# Patient Record
Sex: Female | Born: 1970 | Race: Black or African American | Hispanic: No | Marital: Single | Smoking: Never smoker
Health system: Southern US, Community
[De-identification: ages and names within clinical notes are randomized; demographics above are authoritative.]

## PROBLEM LIST (undated history)

## (undated) DIAGNOSIS — I2510 Atherosclerotic heart disease of native coronary artery without angina pectoris: Secondary | ICD-10-CM

## (undated) DIAGNOSIS — E079 Disorder of thyroid, unspecified: Secondary | ICD-10-CM

## (undated) DIAGNOSIS — E7800 Pure hypercholesterolemia, unspecified: Secondary | ICD-10-CM

## (undated) DIAGNOSIS — M549 Dorsalgia, unspecified: Secondary | ICD-10-CM

## (undated) DIAGNOSIS — G8929 Other chronic pain: Secondary | ICD-10-CM

## (undated) DIAGNOSIS — I10 Essential (primary) hypertension: Secondary | ICD-10-CM

## (undated) HISTORY — PX: THYROID SURGERY: SHX805

## (undated) HISTORY — PX: CORONARY ANGIOPLASTY WITH STENT PLACEMENT: SHX49

## (undated) HISTORY — PX: TOTAL HIP ARTHROPLASTY: SHX124

## (undated) HISTORY — PX: TUBAL LIGATION: SHX77

## (undated) HISTORY — PX: GASTRIC BYPASS: SHX52

## (undated) HISTORY — PX: TONSILLECTOMY: SUR1361

## (undated) HISTORY — PX: JOINT REPLACEMENT: SHX530

---

## 2009-06-25 ENCOUNTER — Ambulatory Visit: Payer: Self-pay | Admitting: Diagnostic Radiology

## 2009-06-25 ENCOUNTER — Emergency Department (HOSPITAL_BASED_OUTPATIENT_CLINIC_OR_DEPARTMENT_OTHER): Admission: EM | Admit: 2009-06-25 | Discharge: 2009-06-25 | Payer: Self-pay | Admitting: Emergency Medicine

## 2009-11-23 ENCOUNTER — Ambulatory Visit: Payer: Self-pay | Admitting: Diagnostic Radiology

## 2009-11-23 ENCOUNTER — Emergency Department (HOSPITAL_BASED_OUTPATIENT_CLINIC_OR_DEPARTMENT_OTHER): Admission: EM | Admit: 2009-11-23 | Discharge: 2009-11-23 | Payer: Self-pay | Admitting: Emergency Medicine

## 2010-07-21 ENCOUNTER — Emergency Department (HOSPITAL_BASED_OUTPATIENT_CLINIC_OR_DEPARTMENT_OTHER)
Admission: EM | Admit: 2010-07-21 | Discharge: 2010-07-21 | Payer: Self-pay | Source: Home / Self Care | Admitting: Emergency Medicine

## 2010-10-05 LAB — URINE CULTURE
Colony Count: 25000
Culture  Setup Time: 201112280653

## 2010-10-05 LAB — URINALYSIS, ROUTINE W REFLEX MICROSCOPIC
Glucose, UA: NEGATIVE mg/dL
Nitrite: NEGATIVE
pH: 5.5 (ref 5.0–8.0)

## 2010-10-05 LAB — URINE MICROSCOPIC-ADD ON

## 2010-10-13 LAB — URINALYSIS, ROUTINE W REFLEX MICROSCOPIC
Bilirubin Urine: NEGATIVE
Glucose, UA: NEGATIVE mg/dL
Protein, ur: NEGATIVE mg/dL
Urobilinogen, UA: 0.2 mg/dL (ref 0.0–1.0)
pH: 5.5 (ref 5.0–8.0)

## 2010-10-13 LAB — DIFFERENTIAL
Basophils Relative: 1 % (ref 0–1)
Lymphocytes Relative: 40 % (ref 12–46)
Lymphs Abs: 1.6 10*3/uL (ref 0.7–4.0)
Monocytes Absolute: 0.3 10*3/uL (ref 0.1–1.0)
Monocytes Relative: 7 % (ref 3–12)
Neutrophils Relative %: 49 % (ref 43–77)

## 2010-10-13 LAB — COMPREHENSIVE METABOLIC PANEL
ALT: 29 U/L (ref 0–35)
Albumin: 4 g/dL (ref 3.5–5.2)
Chloride: 106 mEq/L (ref 96–112)
GFR calc Af Amer: >60 mL/min (ref >60)
Sodium: 145 mEq/L (ref 135–145)
Total Bilirubin: 0.8 mg/dL (ref 0.3–1.2)

## 2010-10-13 LAB — CBC
Platelets: 312 10*3/uL (ref 150–400)
RBC: 4.1 MIL/uL (ref 3.87–5.11)
RDW: 14.4 % (ref 11.5–15.5)
WBC: 4.1 10*3/uL (ref 4.0–10.5)

## 2010-10-13 LAB — LIPASE, BLOOD: Lipase: 65 U/L (ref 23–300)

## 2010-10-13 LAB — URINE MICROSCOPIC-ADD ON

## 2010-10-13 LAB — URINE CULTURE

## 2010-10-27 LAB — CBC
MCHC: 35.1 g/dL (ref 30.0–36.0)
RBC: 3.8 MIL/uL — ABNORMAL LOW (ref 3.87–5.11)
RDW: 14.2 % (ref 11.5–15.5)
WBC: 4.4 10*3/uL (ref 4.0–10.5)

## 2010-10-27 LAB — WET PREP, GENITAL

## 2010-10-27 LAB — BASIC METABOLIC PANEL
CO2: 28 mEq/L (ref 19–32)
Chloride: 108 mEq/L (ref 96–112)
Creatinine, Ser: 1.2 mg/dL (ref 0.4–1.2)
GFR calc Af Amer: >60 mL/min (ref >60)
GFR calc non Af Amer: 50 mL/min — ABNORMAL LOW (ref >60)
Glucose, Bld: 92 mg/dL (ref 70–99)

## 2010-10-27 LAB — URINALYSIS, ROUTINE W REFLEX MICROSCOPIC
Bilirubin Urine: NEGATIVE
Glucose, UA: NEGATIVE mg/dL
Protein, ur: NEGATIVE mg/dL
pH: 5.5 (ref 5.0–8.0)

## 2010-10-27 LAB — DIFFERENTIAL
Basophils Absolute: 0 10*3/uL (ref 0.0–0.1)
Basophils Relative: 1 % (ref 0–1)
Eosinophils Absolute: 0.2 10*3/uL (ref 0.0–0.7)
Lymphs Abs: 1.9 10*3/uL (ref 0.7–4.0)
Neutro Abs: 2 10*3/uL (ref 1.7–7.7)

## 2010-10-27 LAB — PREGNANCY, URINE: Preg Test, Ur: NEGATIVE

## 2010-10-27 LAB — RPR: RPR Ser Ql: NONREACTIVE

## 2010-10-27 LAB — URINE MICROSCOPIC-ADD ON

## 2011-02-24 ENCOUNTER — Emergency Department (HOSPITAL_BASED_OUTPATIENT_CLINIC_OR_DEPARTMENT_OTHER)
Admission: EM | Admit: 2011-02-24 | Discharge: 2011-02-24 | Disposition: A | Discharge status: Home / Self Care | Payer: Self-pay | Attending: Emergency Medicine | Admitting: Emergency Medicine

## 2011-02-24 ENCOUNTER — Encounter: Payer: Self-pay | Admitting: *Deleted

## 2011-02-24 DIAGNOSIS — I10 Essential (primary) hypertension: Secondary | ICD-10-CM | POA: Insufficient documentation

## 2011-02-24 DIAGNOSIS — M545 Low back pain, unspecified: Secondary | ICD-10-CM | POA: Insufficient documentation

## 2011-02-24 DIAGNOSIS — M5430 Sciatica, unspecified side: Secondary | ICD-10-CM | POA: Insufficient documentation

## 2011-02-24 DIAGNOSIS — G8929 Other chronic pain: Secondary | ICD-10-CM | POA: Insufficient documentation

## 2011-02-24 HISTORY — DX: Dorsalgia, unspecified: M54.9

## 2011-02-24 HISTORY — DX: Other chronic pain: G89.29

## 2011-02-24 HISTORY — DX: Essential (primary) hypertension: I10

## 2011-02-24 MED ORDER — CYCLOBENZAPRINE HCL 10 MG PO TABS: 10.0000 mg | ORAL_TABLET | Freq: Two times a day (BID) | ORAL | Status: AC | PRN

## 2011-02-24 MED ORDER — HYDROCODONE-ACETAMINOPHEN 5-500 MG PO TABS: 1.0000 | ORAL_TABLET | Freq: Four times a day (QID) | ORAL | Status: AC | PRN

## 2011-02-24 MED ORDER — DEXAMETHASONE SODIUM PHOSPHATE 10 MG/ML IJ SOLN
10.0000 mg | Freq: Once | INTRAMUSCULAR | Status: AC
  Administered 2011-02-24: 10 mg via INTRAMUSCULAR

## 2011-02-24 MED ORDER — IBUPROFEN 800 MG PO TABS: 800.0000 mg | ORAL_TABLET | Freq: Three times a day (TID) | ORAL | Status: AC

## 2011-02-24 MED ORDER — KETOROLAC TROMETHAMINE 60 MG/2ML IM SOLN
60.0000 mg | Freq: Once | INTRAMUSCULAR | Status: AC
  Administered 2011-02-24: 60 mg via INTRAMUSCULAR

## 2011-02-24 NOTE — ED Notes (Signed)
Pt reports chronic back pain x months. Pt reports pain medications not working and wants MRI.

## 2011-02-24 NOTE — ED Provider Notes (Signed)
History     Chief Complaint  Patient presents with  . Back Pain  . Leg Pain   HPI Comments: Patient states that she has had lower back pain for a "long time". This has been a left lower back, left buttock, left upper leg pain that is sharp and shooting, radiates down to the knee. It is worse with straightening her leg and better when she flexes at the knee and the hip. She has relief at night when she's lying on her side. She denies urinary symptoms, fever, numbness, weakness of the leg. Symptoms are constant and has not improved with over-the-counter medications which her family doctor as prescribed.  Patient is a 40 y.o. female presenting with back pain and leg pain. The history is provided by the patient and a relative.  Back Pain  Associated symptoms include leg pain. Pertinent negatives include no fever, no numbness and no weakness.  Leg Pain  Pertinent negatives include no numbness.    Past Medical History  Diagnosis Date  . Chronic back pain   . Hypertension     Past Surgical History  Procedure Date  . Cesarean section   . Thyroid surgery   . Tonsillectomy     No family history on file.  History  Substance Use Topics  . Smoking status: Never Smoker   . Smokeless tobacco: Not on file  . Alcohol Use: No    OB History    Grav Para Term Preterm Abortions TAB SAB Ect Mult Living                  Review of Systems  Constitutional: Negative for fever and chills.  HENT: Negative for neck pain.   Gastrointestinal: Negative for nausea, vomiting and diarrhea.  Genitourinary: Negative for difficulty urinating.  Musculoskeletal: Positive for back pain.  Skin: Negative for rash.  Neurological: Negative for weakness and numbness.    Physical Exam  BP 162/116  Pulse 77  Temp(Src) 98.9 F (37.2 C) (Oral)  Resp 16  Ht 5\' 6"  (1.676 m)  Wt 270 lb (122.471 kg)  BMI 43.58 kg/m2  SpO2 98%  LMP 02/22/2011  Physical Exam  Constitutional: She appears well-developed  and well-nourished. No distress.  HENT:  Head: Normocephalic and atraumatic.  Eyes: Conjunctivae are normal. No scleral icterus.  Cardiovascular: Normal rate and regular rhythm.   No murmur heard. Pulmonary/Chest: Effort normal and breath sounds normal. No respiratory distress. She has no wheezes. She has no rales.  Musculoskeletal: Normal range of motion. She exhibits tenderness. She exhibits no edema.       Tenderness to palpation of the left lower back, left buttock. No central spinal tenderness to palpation on the thoracic or lumbar spines.  Neurological: She is alert.       Normal sensation and motor of the bilateral lower extremities.  Skin: Skin is warm and dry. No rash noted. She is not diaphoretic.    ED Course  Procedures  MDM Patient states that she has this chronic pain which does not appear to have any neurologic deficits with it. It is consistent with sciatica. Will give intramuscular Decadron and Toradol prior to discharge. I will recommend her followup with a neurosurgeon for further spinal evaluation.      Vida Roller, MD 02/24/11 [phone]

## 2011-03-29 ENCOUNTER — Emergency Department (HOSPITAL_BASED_OUTPATIENT_CLINIC_OR_DEPARTMENT_OTHER)
Admission: EM | Admit: 2011-03-29 | Discharge: 2011-03-29 | Disposition: A | Discharge status: Home / Self Care | Payer: Self-pay | Attending: Emergency Medicine | Admitting: Emergency Medicine

## 2011-03-29 ENCOUNTER — Encounter (HOSPITAL_BASED_OUTPATIENT_CLINIC_OR_DEPARTMENT_OTHER): Payer: Self-pay | Admitting: *Deleted

## 2011-03-29 DIAGNOSIS — E7800 Pure hypercholesterolemia, unspecified: Secondary | ICD-10-CM | POA: Insufficient documentation

## 2011-03-29 DIAGNOSIS — E079 Disorder of thyroid, unspecified: Secondary | ICD-10-CM | POA: Insufficient documentation

## 2011-03-29 DIAGNOSIS — G8929 Other chronic pain: Secondary | ICD-10-CM | POA: Insufficient documentation

## 2011-03-29 DIAGNOSIS — I10 Essential (primary) hypertension: Secondary | ICD-10-CM | POA: Insufficient documentation

## 2011-03-29 DIAGNOSIS — J069 Acute upper respiratory infection, unspecified: Secondary | ICD-10-CM | POA: Insufficient documentation

## 2011-03-29 HISTORY — DX: Pure hypercholesterolemia, unspecified: E78.00

## 2011-03-29 HISTORY — DX: Disorder of thyroid, unspecified: E07.9

## 2011-03-29 MED ORDER — CETIRIZINE-PSEUDOEPHEDRINE ER 5-120 MG PO TB12: 1.0000 | ORAL_TABLET | Freq: Two times a day (BID) | ORAL | Status: AC

## 2011-03-29 NOTE — ED Provider Notes (Signed)
History     CSN: [id number] Arrival date & time: 03/29/2011  7:50 PM  Chief Complaint  Patient presents with  . Nasal Congestion   Patient is a 40 y.o. female presenting with URI. The history is provided by the patient. No language interpreter was used.  URI The primary symptoms include headaches and cough. Primary symptoms do not include fever, vomiting or myalgias. The current episode started today. This is a new problem.  Symptoms associated with the illness include facial pain, sinus pressure and rhinorrhea.    Past Medical History  Diagnosis Date  . Chronic back pain   . Hypertension   . High cholesterol   . Thyroid disease     Past Surgical History  Procedure Date  . Cesarean section   . Thyroid surgery   . Tonsillectomy   . Thyroid surgery   . Cesarean section     No family history on file.  History  Substance Use Topics  . Smoking status: Never Smoker   . Smokeless tobacco: Not on file  . Alcohol Use: No    OB History    Grav Para Term Preterm Abortions TAB SAB Ect Mult Living                  Review of Systems  Constitutional: Negative for fever.  HENT: Positive for rhinorrhea and sinus pressure.   Respiratory: Positive for cough.   Cardiovascular: Negative.   Gastrointestinal: Negative for vomiting.  Musculoskeletal: Negative for myalgias.  Neurological: Positive for headaches.    Physical Exam  BP 179/96  Pulse 100  Temp(Src) 99.1 F (37.3 C) (Oral)  Resp 22  SpO2 100%  Physical Exam  Vitals reviewed. Constitutional: She is oriented to person, place, and time. She appears well-developed and well-nourished.  HENT:  Head: Normocephalic and atraumatic.  Right Ear: External ear normal.  Left Ear: External ear normal.  Nose: Rhinorrhea present.  Mouth/Throat: Oropharynx is clear and moist.  Eyes: Pupils are equal, round, and reactive to light.  Cardiovascular: Normal rate and regular rhythm.   Pulmonary/Chest: Effort normal and breath  sounds normal.  Musculoskeletal: Normal range of motion.  Neurological: She is alert and oriented to person, place, and time.  Skin: Skin is warm and dry.    ED Course  Procedures  MDM Will treat symptomatically no need for antibiotics at this time      Teressa Lower, NP 03/29/11 2052

## 2011-03-29 NOTE — ED Provider Notes (Signed)
Medical screening examination/treatment/procedure(s) were performed by non-physician practitioner and as supervising physician I was immediately available for consultation/collaboration.   Lashawndra Lampkins, MD 03/29/11 2326 

## 2011-03-29 NOTE — ED Notes (Signed)
Nasal congestion, facial pain, headache and right arm throbbing. Symptoms started this am.

## 2011-06-05 ENCOUNTER — Encounter (HOSPITAL_BASED_OUTPATIENT_CLINIC_OR_DEPARTMENT_OTHER): Payer: Self-pay | Admitting: Emergency Medicine

## 2011-06-05 ENCOUNTER — Emergency Department (HOSPITAL_BASED_OUTPATIENT_CLINIC_OR_DEPARTMENT_OTHER)
Admission: EM | Admit: 2011-06-05 | Discharge: 2011-06-05 | Disposition: A | Discharge status: Home / Self Care | Payer: Self-pay | Attending: Emergency Medicine | Admitting: Emergency Medicine

## 2011-06-05 DIAGNOSIS — E7800 Pure hypercholesterolemia, unspecified: Secondary | ICD-10-CM | POA: Insufficient documentation

## 2011-06-05 DIAGNOSIS — E079 Disorder of thyroid, unspecified: Secondary | ICD-10-CM | POA: Insufficient documentation

## 2011-06-05 DIAGNOSIS — H109 Unspecified conjunctivitis: Secondary | ICD-10-CM | POA: Insufficient documentation

## 2011-06-05 DIAGNOSIS — G8929 Other chronic pain: Secondary | ICD-10-CM | POA: Insufficient documentation

## 2011-06-05 DIAGNOSIS — I10 Essential (primary) hypertension: Secondary | ICD-10-CM | POA: Insufficient documentation

## 2011-06-05 DIAGNOSIS — J069 Acute upper respiratory infection, unspecified: Secondary | ICD-10-CM | POA: Insufficient documentation

## 2011-06-05 MED ORDER — POLYMYXIN B-TRIMETHOPRIM 10000-0.1 UNIT/ML-% OP SOLN
2.0000 [drp] | OPHTHALMIC | Status: DC
  Administered 2011-06-05: 2 [drp] via OPHTHALMIC
  Filled 2011-06-05: qty 10

## 2011-06-05 NOTE — ED Provider Notes (Signed)
History     CSN: [id number] Arrival date & time: 06/05/2011 12:32 PM   First MD Initiated Contact with Patient 06/05/11 1244      Chief Complaint  Patient presents with  . Nasal Congestion    pt reports sinus congestion with Sore throat x 1 week    (Consider location/radiation/quality/duration/timing/severity/associated sxs/prior treatment) HPI Patient presents with a complaint of nasal congestion and postnasal drip as well as left eye redness and irritation. She states the nasal congestion has been present for approximately one week. Her IV came red and painful about 2 days ago. She's had no changes in her vision. No fevers and no difficulty breathing. She's also had a mild nonproductive cough. No headaches. She has continued to drink liquids well. She takes Zyrtec-D as well as TheraFlu and states this has not been helping. She denies any other systemic symptoms.  Past Medical History  Diagnosis Date  . Chronic back pain   . Hypertension   . High cholesterol   . Thyroid disease     Past Surgical History  Procedure Date  . Cesarean section   . Thyroid surgery   . Tonsillectomy   . Thyroid surgery   . Cesarean section     History reviewed. No pertinent family history.  History  Substance Use Topics  . Smoking status: Never Smoker   . Smokeless tobacco: Not on file  . Alcohol Use: No    OB History    Grav Para Term Preterm Abortions TAB SAB Ect Mult Living                  Review of Systems ROS reviewed and otherwise negative except for mentioned in HPI Allergies  Review of patient's allergies indicates no known allergies.  Home Medications   Current Outpatient Rx  Name Route Sig Dispense Refill  . CETIRIZINE-PSEUDOEPHEDRINE 5-120 MG PO TB12 Oral Take 1 tablet by mouth 2 (two) times daily. 30 tablet 0  . HYDROCHLOROTHIAZIDE 25 MG PO TABS Oral Take 25 mg by mouth daily.      Marland Kitchen LISINOPRIL 10 MG PO TABS Oral Take 10 mg by mouth daily.      Marland Kitchen METOPROLOL  TARTRATE 25 MG PO TABS Oral Take 25 mg by mouth 2 (two) times daily.      Juanita Laster COLD & SORE THROAT PO Oral Take 1 packet by mouth once as needed.      Marland Kitchen ROSUVASTATIN CALCIUM 10 MG PO TABS Oral Take 10 mg by mouth daily.        BP 133/60  Pulse 77  Temp(Src) 98.7 F (37.1 C) (Oral)  Resp 24  Wt 235 lb (106.595 kg)  SpO2 99%  LMP 05/08/2011 Vitals reviewed Physical Exam Physical Examination: General appearance - alert, well appearing, and in no distress Mental status - alert, oriented to person, place, and time Eyes - pupils equal and reactive, extraocular eye movements intact, left conjunctival injection Nose - normal and patent, nasal turbinates erythematous Mouth - mucous membranes moist, pharynx normal without lesions, no erythema of OP Chest - clear to auscultation, no wheezes, rales or rhonchi, symmetric air entry Heart - normal rate, regular rhythm, normal S1, S2, no murmurs, rubs, clicks or gallops Abdomen - soft, nontender, nondistended, no masses or organomegaly Musculoskeletal - no joint tenderness, deformity or swelling Extremities - peripheral pulses normal, no pedal edema, no clubbing or cyanosis Skin - normal coloration and turgor, no rashes, no suspicious skin lesions noted  ED Course  Procedures (  including critical care time)  Labs Reviewed - No data to display No results found.   1. Upper respiratory infection   2. Conjunctivitis       MDM  Patient presenting with nasal congestion and postnasal drip without signs or symptoms of pneumonia or any other acute bacterial infection. Recommended continuing TheraFlu as needed for symptoms and continuing Zyrtec-D. She also appears to have left conjunctivitis. Recommended warm compresses 3 times daily and will also give Polytrim drops started in the ED. Patient was discharged with strict return precautions and recommended to followup with her primary doctor. She is agreeable with this plan.        Ethelda Chick, MD 06/05/11 1409

## 2011-06-05 NOTE — ED Notes (Signed)
Took theraflu, but no relief, no v/d, chills at night

## 2011-06-05 NOTE — ED Notes (Signed)
Pt present with sinus congestion and drainage x 1 week

## 2012-01-14 ENCOUNTER — Emergency Department (HOSPITAL_BASED_OUTPATIENT_CLINIC_OR_DEPARTMENT_OTHER)
Admission: EM | Admit: 2012-01-14 | Discharge: 2012-01-14 | Disposition: A | Discharge status: Home / Self Care | Payer: Self-pay | Attending: Emergency Medicine | Admitting: Emergency Medicine

## 2012-01-14 ENCOUNTER — Encounter (HOSPITAL_BASED_OUTPATIENT_CLINIC_OR_DEPARTMENT_OTHER): Payer: Self-pay | Admitting: *Deleted

## 2012-01-14 DIAGNOSIS — I10 Essential (primary) hypertension: Secondary | ICD-10-CM | POA: Insufficient documentation

## 2012-01-14 DIAGNOSIS — M5430 Sciatica, unspecified side: Secondary | ICD-10-CM | POA: Diagnosis present

## 2012-01-14 DIAGNOSIS — E7800 Pure hypercholesterolemia, unspecified: Secondary | ICD-10-CM | POA: Insufficient documentation

## 2012-01-14 DIAGNOSIS — G8929 Other chronic pain: Secondary | ICD-10-CM | POA: Insufficient documentation

## 2012-01-14 DIAGNOSIS — Z79899 Other long term (current) drug therapy: Secondary | ICD-10-CM | POA: Insufficient documentation

## 2012-01-14 DIAGNOSIS — Z9861 Coronary angioplasty status: Secondary | ICD-10-CM | POA: Insufficient documentation

## 2012-01-14 DIAGNOSIS — E079 Disorder of thyroid, unspecified: Secondary | ICD-10-CM | POA: Insufficient documentation

## 2012-01-14 DIAGNOSIS — I2510 Atherosclerotic heart disease of native coronary artery without angina pectoris: Secondary | ICD-10-CM | POA: Insufficient documentation

## 2012-01-14 HISTORY — DX: Atherosclerotic heart disease of native coronary artery without angina pectoris: I25.10

## 2012-01-14 MED ORDER — DEXAMETHASONE SODIUM PHOSPHATE 4 MG/ML IJ SOLN
10.0000 mg | Freq: Once | INTRAMUSCULAR | Status: AC
  Administered 2012-01-14: 10 mg via INTRAMUSCULAR
  Filled 2012-01-14: qty 1

## 2012-01-14 MED ORDER — DEXAMETHASONE SODIUM PHOSPHATE 4 MG/ML IJ SOLN
INTRAMUSCULAR | Status: AC
  Filled 2012-01-14: qty 2

## 2012-01-14 MED ORDER — OXYCODONE-ACETAMINOPHEN 5-325 MG PO TABS: 1.0000 | ORAL_TABLET | Freq: Four times a day (QID) | ORAL | Status: AC | PRN

## 2012-01-14 MED ORDER — IBUPROFEN 800 MG PO TABS: 800.0000 mg | ORAL_TABLET | Freq: Three times a day (TID) | ORAL | Status: AC

## 2012-01-14 MED ORDER — HYDROMORPHONE HCL PF 1 MG/ML IJ SOLN
1.0000 mg | Freq: Once | INTRAMUSCULAR | Status: AC
  Administered 2012-01-14: 1 mg via INTRAMUSCULAR
  Filled 2012-01-14: qty 1

## 2012-01-14 NOTE — ED Notes (Signed)
Patient states she has a history of low back pain.  Yesterday she developed low back pain with radiation into bilateral buttocks, left hip and left leg pain down to her left foot.  No known injury, this is the worse pain she has ever had with her back before.

## 2012-01-14 NOTE — Discharge Instructions (Signed)

## 2012-01-14 NOTE — ED Provider Notes (Signed)
History     CSN: [id number]  Arrival date & time 01/14/12  1154   First MD Initiated Contact with Patient 01/14/12 1243      Chief Complaint  Patient presents with  . Back Pain    (Consider location/radiation/quality/duration/timing/severity/associated sxs/prior treatment) HPI Pt with history of low back pain and sciatica reports she woke up yesterday with severe aching L lower back and buttock pain radiating into his left leg. No incontinence, fever or neurologic signs.  No new imaging.  Past Medical History  Diagnosis Date  . Chronic back pain   . Hypertension   . High cholesterol   . Thyroid disease   . Coronary artery disease     Past Surgical History  Procedure Date  . Cesarean section   . Thyroid surgery   . Tonsillectomy   . Thyroid surgery   . Cesarean section   . Coronary angioplasty with stent placement     No family history on file.  History  Substance Use Topics  . Smoking status: Never Smoker   . Smokeless tobacco: Not on file  . Alcohol Use: No    OB History    Grav Para Term Preterm Abortions TAB SAB Ect Mult Living                  Review of Systems All other systems reviewed and are negative except as noted in HPI.   Allergies  Review of patient's allergies indicates no known allergies.  Home Medications   Current Outpatient Rx  Name Route Sig Dispense Refill  . CETIRIZINE-PSEUDOEPHEDRINE ER 5-120 MG PO TB12 Oral Take 1 tablet by mouth 2 (two) times daily. 30 tablet 0  . HYDROCHLOROTHIAZIDE 25 MG PO TABS Oral Take 25 mg by mouth daily.      Marland Kitchen LISINOPRIL 10 MG PO TABS Oral Take 10 mg by mouth daily.      Marland Kitchen METOPROLOL TARTRATE 25 MG PO TABS Oral Take 25 mg by mouth 2 (two) times daily.      Juanita Laster COLD & SORE THROAT PO Oral Take 1 packet by mouth once as needed.      Marland Kitchen ROSUVASTATIN CALCIUM 10 MG PO TABS Oral Take 10 mg by mouth daily.        BP 137/95  Pulse 98  Temp 98.5 F (36.9 C) (Oral)  Resp 18  Ht 5\' 4"  (1.626 m)  Wt  272 lb (123.378 kg)  BMI 46.69 kg/m2  SpO2 99%  LMP 12/25/2011  Physical Exam  Nursing note and vitals reviewed. Constitutional: She is oriented to person, place, and time. She appears well-developed and well-nourished.  HENT:  Head: Normocephalic and atraumatic.  Eyes: EOM are normal. Pupils are equal, round, and reactive to light.  Neck: Normal range of motion. Neck supple.  Cardiovascular: Normal rate, normal heart sounds and intact distal pulses.   Pulmonary/Chest: Effort normal and breath sounds normal.  Abdominal: Bowel sounds are normal. She exhibits no distension. There is no tenderness.  Musculoskeletal: Normal range of motion. She exhibits tenderness. She exhibits no edema.       Tender over the L lower lumbar region and L sciatic notch  Neurological: She is alert and oriented to person, place, and time. She has normal strength. She displays normal reflexes. No cranial nerve deficit or sensory deficit. She exhibits normal muscle tone.  Skin: Skin is warm and dry. No rash noted.  Psychiatric: She has a normal mood and affect.    ED Course  Procedures (including critical care time)  Labs Reviewed - No data to display No results found.   No diagnosis found.    MDM  Pt with exacerbation of her chronic sciatica. Has had good relief with decadron in the past. Advised neurosurg followup.         Maddelyn Rocca B. Bernette Mayers, MD 01/14/12 1312

## 2012-05-05 ENCOUNTER — Encounter (HOSPITAL_BASED_OUTPATIENT_CLINIC_OR_DEPARTMENT_OTHER): Payer: Self-pay | Admitting: *Deleted

## 2012-05-05 ENCOUNTER — Emergency Department (HOSPITAL_BASED_OUTPATIENT_CLINIC_OR_DEPARTMENT_OTHER)
Admission: EM | Admit: 2012-05-05 | Discharge: 2012-05-05 | Disposition: A | Discharge status: Home / Self Care | Payer: Self-pay | Attending: Emergency Medicine | Admitting: Emergency Medicine

## 2012-05-05 DIAGNOSIS — N946 Dysmenorrhea, unspecified: Secondary | ICD-10-CM | POA: Insufficient documentation

## 2012-05-05 DIAGNOSIS — Z9861 Coronary angioplasty status: Secondary | ICD-10-CM | POA: Insufficient documentation

## 2012-05-05 DIAGNOSIS — M549 Dorsalgia, unspecified: Secondary | ICD-10-CM | POA: Insufficient documentation

## 2012-05-05 DIAGNOSIS — A499 Bacterial infection, unspecified: Secondary | ICD-10-CM | POA: Insufficient documentation

## 2012-05-05 DIAGNOSIS — I10 Essential (primary) hypertension: Secondary | ICD-10-CM | POA: Insufficient documentation

## 2012-05-05 DIAGNOSIS — E890 Postprocedural hypothyroidism: Secondary | ICD-10-CM | POA: Insufficient documentation

## 2012-05-05 DIAGNOSIS — I2510 Atherosclerotic heart disease of native coronary artery without angina pectoris: Secondary | ICD-10-CM | POA: Insufficient documentation

## 2012-05-05 DIAGNOSIS — N760 Acute vaginitis: Secondary | ICD-10-CM | POA: Insufficient documentation

## 2012-05-05 DIAGNOSIS — B9689 Other specified bacterial agents as the cause of diseases classified elsewhere: Secondary | ICD-10-CM | POA: Insufficient documentation

## 2012-05-05 DIAGNOSIS — E7800 Pure hypercholesterolemia, unspecified: Secondary | ICD-10-CM | POA: Insufficient documentation

## 2012-05-05 DIAGNOSIS — G8929 Other chronic pain: Secondary | ICD-10-CM | POA: Insufficient documentation

## 2012-05-05 LAB — URINALYSIS, ROUTINE W REFLEX MICROSCOPIC
Bilirubin Urine: NEGATIVE
Ketones, ur: NEGATIVE mg/dL
Leukocytes, UA: NEGATIVE
Nitrite: NEGATIVE
Urobilinogen, UA: 0.2 mg/dL (ref 0.0–1.0)
pH: 5.5 (ref 5.0–8.0)

## 2012-05-05 MED ORDER — HYDROCODONE-ACETAMINOPHEN 5-500 MG PO TABS: 1.0000 | ORAL_TABLET | Freq: Four times a day (QID) | ORAL | Status: DC | PRN

## 2012-05-05 MED ORDER — OXYCODONE-ACETAMINOPHEN 5-325 MG PO TABS
2.0000 | ORAL_TABLET | Freq: Once | ORAL | Status: AC
  Administered 2012-05-05: 2 via ORAL
  Filled 2012-05-05 (×2): qty 2

## 2012-05-05 MED ORDER — METRONIDAZOLE 500 MG PO TABS: 500.0000 mg | ORAL_TABLET | Freq: Two times a day (BID) | ORAL | Status: DC

## 2012-05-05 NOTE — ED Notes (Signed)
abd pain, nausea no menses x 2 months.

## 2012-05-05 NOTE — ED Provider Notes (Signed)
History     CSN: [id number]  Arrival date & time 05/05/12  1730   First MD Initiated Contact with Patient 05/05/12 2008      Chief Complaint  Patient presents with  . Abdominal Pain    (Consider location/radiation/quality/duration/timing/severity/associated sxs/prior treatment) HPI Comments: She complains of a two-day history of lower, cramping. She states it's in the middle of her abdomen and radiates around to the lower sides. She complains of some nausea but no vomiting. She's had some urinary frequency but no burning on urination or hematuria. Denies any vaginal bleeding or discharge. Her last menstrual period was in July. She denies any fevers. She states the cramping in has been worse in the last day. Denies any history of similar findings in the past. She's not taking anything at home for the symptoms.  Patient is a 41 y.o. female presenting with abdominal pain. The history is provided by the patient.  Abdominal Pain The primary symptoms of the illness include abdominal pain. The primary symptoms of the illness do not include fever, fatigue, shortness of breath, nausea, vomiting or diarrhea.  Additional symptoms associated with the illness include frequency. Symptoms associated with the illness do not include chills, diaphoresis, hematuria or back pain.    Past Medical History  Diagnosis Date  . Chronic back pain   . Hypertension   . High cholesterol   . Thyroid disease   . Coronary artery disease     Past Surgical History  Procedure Date  . Cesarean section   . Thyroid surgery   . Tonsillectomy   . Thyroid surgery   . Cesarean section   . Coronary angioplasty with stent placement     No family history on file.  History  Substance Use Topics  . Smoking status: Never Smoker   . Smokeless tobacco: Not on file  . Alcohol Use: No    OB History    Grav Para Term Preterm Abortions TAB SAB Ect Mult Living                  Review of Systems  Constitutional:  Negative for fever, chills, diaphoresis and fatigue.  HENT: Negative for congestion, rhinorrhea and sneezing.   Eyes: Negative.   Respiratory: Negative for cough, chest tightness and shortness of breath.   Cardiovascular: Negative for chest pain and leg swelling.  Gastrointestinal: Positive for abdominal pain. Negative for nausea, vomiting, diarrhea and blood in stool.  Genitourinary: Positive for frequency. Negative for hematuria, flank pain and difficulty urinating.  Musculoskeletal: Negative for back pain and arthralgias.  Skin: Negative for rash.  Neurological: Negative for dizziness, speech difficulty, weakness, numbness and headaches.    Allergies  Review of patient's allergies indicates no known allergies.  Home Medications   Current Outpatient Rx  Name Route Sig Dispense Refill  . HYDROCHLOROTHIAZIDE 25 MG PO TABS Oral Take 25 mg by mouth daily.      Marland Kitchen HYDROCODONE-ACETAMINOPHEN 5-500 MG PO TABS Oral Take 1-2 tablets by mouth every 6 (six) hours as needed for pain. 15 tablet 0  . LISINOPRIL 10 MG PO TABS Oral Take 10 mg by mouth daily.      Marland Kitchen METOPROLOL TARTRATE 25 MG PO TABS Oral Take 25 mg by mouth 2 (two) times daily.      Marland Kitchen METRONIDAZOLE 500 MG PO TABS Oral Take 1 tablet (500 mg total) by mouth 2 (two) times daily. 14 tablet 0  . THERAFLU COLD & SORE THROAT PO Oral Take 1 packet by  mouth once as needed.      Marland Kitchen ROSUVASTATIN CALCIUM 10 MG PO TABS Oral Take 10 mg by mouth daily.        BP 181/103  Pulse 91  Temp 98.3 F (36.8 C) (Oral)  Resp 18  SpO2 98%  LMP 02/17/2012  Physical Exam  Constitutional: She is oriented to person, place, and time. She appears well-developed and well-nourished.  HENT:  Head: Normocephalic and atraumatic.  Eyes: Pupils are equal, round, and reactive to light.  Neck: Normal range of motion. Neck supple.  Cardiovascular: Normal rate, regular rhythm and normal heart sounds.   Pulmonary/Chest: Effort normal and breath sounds normal. No  respiratory distress. She has no wheezes. She has no rales. She exhibits no tenderness.  Abdominal: Soft. Bowel sounds are normal. There is tenderness (mild tenderness to the suprapubic area). There is no rebound and no guarding.  Genitourinary: Vaginal discharge (slight white) found.       No CMT, no obvious adnexal tenderness or mass, but limited due to pt size  Musculoskeletal: Normal range of motion. She exhibits no edema.  Lymphadenopathy:    She has no cervical adenopathy.  Neurological: She is alert and oriented to person, place, and time.  Skin: Skin is warm and dry. No rash noted.  Psychiatric: She has a normal mood and affect.    ED Course  Procedures (including critical care time)  Results for orders placed during the hospital encounter of 05/05/12  URINALYSIS, ROUTINE W REFLEX MICROSCOPIC      Component Value Range   Color, Urine YELLOW  YELLOW   APPearance CLOUDY (*) CLEAR   Specific Gravity, Urine 1.017  1.005 - 1.030   pH 5.5  5.0 - 8.0   Glucose, UA NEGATIVE  NEGATIVE mg/dL   Hgb urine dipstick NEGATIVE  NEGATIVE   Bilirubin Urine NEGATIVE  NEGATIVE   Ketones, ur NEGATIVE  NEGATIVE mg/dL   Protein, ur NEGATIVE  NEGATIVE mg/dL   Urobilinogen, UA 0.2  0.0 - 1.0 mg/dL   Nitrite NEGATIVE  NEGATIVE   Leukocytes, UA NEGATIVE  NEGATIVE  PREGNANCY, URINE      Component Value Range   Preg Test, Ur NEGATIVE  NEGATIVE  WET PREP, GENITAL      Component Value Range   Yeast Wet Prep HPF POC NONE SEEN  NONE SEEN   Trich, Wet Prep NONE SEEN  NONE SEEN   Clue Cells Wet Prep HPF POC MODERATE (*) NONE SEEN   WBC, Wet Prep HPF POC RARE (*) NONE SEEN   No results found.    1. BV (bacterial vaginosis)   2. Dysmenorrhea       MDM  Will tx pt for BV, abd exam benign, not consistent with torsion/TOA.  No pain over appendix.  Will tx with flaygl, vicodin, f/u with her ob/gyn        Rolan Bucco, MD 05/05/12 2133

## 2012-05-06 LAB — GC/CHLAMYDIA PROBE AMP, GENITAL
Chlamydia, DNA Probe: NEGATIVE
GC Probe Amp, Genital: NEGATIVE

## 2012-10-05 ENCOUNTER — Encounter (HOSPITAL_BASED_OUTPATIENT_CLINIC_OR_DEPARTMENT_OTHER): Payer: Self-pay | Admitting: *Deleted

## 2012-10-05 ENCOUNTER — Emergency Department (HOSPITAL_BASED_OUTPATIENT_CLINIC_OR_DEPARTMENT_OTHER)
Admission: EM | Admit: 2012-10-05 | Discharge: 2012-10-05 | Disposition: A | Discharge status: Home / Self Care | Payer: Self-pay | Attending: Emergency Medicine | Admitting: Emergency Medicine

## 2012-10-05 DIAGNOSIS — M5430 Sciatica, unspecified side: Secondary | ICD-10-CM | POA: Insufficient documentation

## 2012-10-05 DIAGNOSIS — I2510 Atherosclerotic heart disease of native coronary artery without angina pectoris: Secondary | ICD-10-CM | POA: Insufficient documentation

## 2012-10-05 DIAGNOSIS — Z9861 Coronary angioplasty status: Secondary | ICD-10-CM | POA: Insufficient documentation

## 2012-10-05 DIAGNOSIS — E079 Disorder of thyroid, unspecified: Secondary | ICD-10-CM | POA: Insufficient documentation

## 2012-10-05 DIAGNOSIS — Z79899 Other long term (current) drug therapy: Secondary | ICD-10-CM | POA: Insufficient documentation

## 2012-10-05 DIAGNOSIS — Z3202 Encounter for pregnancy test, result negative: Secondary | ICD-10-CM | POA: Insufficient documentation

## 2012-10-05 DIAGNOSIS — G8929 Other chronic pain: Secondary | ICD-10-CM | POA: Insufficient documentation

## 2012-10-05 DIAGNOSIS — I10 Essential (primary) hypertension: Secondary | ICD-10-CM | POA: Insufficient documentation

## 2012-10-05 DIAGNOSIS — E7800 Pure hypercholesterolemia, unspecified: Secondary | ICD-10-CM | POA: Insufficient documentation

## 2012-10-05 DIAGNOSIS — M5432 Sciatica, left side: Secondary | ICD-10-CM

## 2012-10-05 LAB — URINALYSIS, ROUTINE W REFLEX MICROSCOPIC
Leukocytes, UA: NEGATIVE
Nitrite: NEGATIVE
Specific Gravity, Urine: 1.021 (ref 1.005–1.030)
Urobilinogen, UA: 0.2 mg/dL (ref 0.0–1.0)
pH: 5 (ref 5.0–8.0)

## 2012-10-05 LAB — PREGNANCY, URINE: Preg Test, Ur: NEGATIVE

## 2012-10-05 MED ORDER — METHYLPREDNISOLONE 4 MG PO KIT: PACK | ORAL | Status: DC

## 2012-10-05 MED ORDER — KETOROLAC TROMETHAMINE 60 MG/2ML IM SOLN
60.0000 mg | Freq: Once | INTRAMUSCULAR | Status: AC
  Administered 2012-10-05: 60 mg via INTRAMUSCULAR
  Filled 2012-10-05: qty 2

## 2012-10-05 MED ORDER — DIAZEPAM 5 MG PO TABS
5.0000 mg | ORAL_TABLET | Freq: Once | ORAL | Status: AC
  Administered 2012-10-05: 5 mg via ORAL
  Filled 2012-10-05: qty 1

## 2012-10-05 MED ORDER — IBUPROFEN 800 MG PO TABS: 800.0000 mg | ORAL_TABLET | Freq: Three times a day (TID) | ORAL | Status: DC

## 2012-10-05 MED ORDER — OXYCODONE-ACETAMINOPHEN 5-325 MG PO TABS: 2.0000 | ORAL_TABLET | ORAL | Status: DC | PRN

## 2012-10-05 MED ORDER — HYDROMORPHONE HCL PF 2 MG/ML IJ SOLN
2.0000 mg | Freq: Once | INTRAMUSCULAR | Status: AC
  Administered 2012-10-05: 2 mg via INTRAMUSCULAR
  Filled 2012-10-05: qty 1

## 2012-10-05 NOTE — ED Provider Notes (Signed)
History     CSN: [id number]  Arrival date & time 10/05/12  1842   First MD Initiated Contact with Patient 10/05/12 1945      Chief Complaint  Patient presents with  . Back Pain    (Consider location/radiation/quality/duration/timing/severity/associated sxs/prior treatment) HPI Comments: Obese patient with history of sciatica presenting with left-sided low back pain, buttock pain and leg pain for the past 4 days. Feels similar to her previous sciatica. Denies any difficulty with urination. No blood or bladder incontinence. Denies any lifting injuries or twisting injuries. Denies any abdominal pain, nausea, vomiting or fever. No illicit drug use or cancer history. No dysuria hematuria. She's been taking naproxen without relief.  The history is provided by the patient.    Past Medical History  Diagnosis Date  . Chronic back pain   . Hypertension   . High cholesterol   . Thyroid disease   . Coronary artery disease     Past Surgical History  Procedure Laterality Date  . Cesarean section    . Thyroid surgery    . Tonsillectomy    . Thyroid surgery    . Cesarean section    . Coronary angioplasty with stent placement      No family history on file.  History  Substance Use Topics  . Smoking status: Never Smoker   . Smokeless tobacco: Not on file  . Alcohol Use: No    OB History   Grav Para Term Preterm Abortions TAB SAB Ect Mult Living                  Review of Systems  Constitutional: Negative for fever, activity change and appetite change.  HENT: Negative for congestion and rhinorrhea.   Respiratory: Negative for cough, chest tightness and shortness of breath.   Cardiovascular: Negative for chest pain.  Gastrointestinal: Negative for nausea, vomiting and abdominal pain.  Genitourinary: Negative for dysuria and hematuria.  Musculoskeletal: Positive for back pain. Negative for myalgias and arthralgias.  Skin: Negative for rash.  Neurological: Negative for  dizziness, weakness and headaches.  A complete 10 system review of systems was obtained and all systems are negative except as noted in the HPI and PMH.    Allergies  Review of patient's allergies indicates no known allergies.  Home Medications   Current Outpatient Rx  Name  Route  Sig  Dispense  Refill  . hydrochlorothiazide 25 MG tablet   Oral   Take 25 mg by mouth daily.           Marland Kitchen HYDROcodone-acetaminophen (VICODIN) 5-500 MG per tablet   Oral   Take 1-2 tablets by mouth every 6 (six) hours as needed for pain.   15 tablet   0   . ibuprofen (ADVIL,MOTRIN) 800 MG tablet   Oral   Take 1 tablet (800 mg total) by mouth 3 (three) times daily.   21 tablet   0   . lisinopril (PRINIVIL,ZESTRIL) 10 MG tablet   Oral   Take 10 mg by mouth daily.           . methylPREDNISolone (MEDROL, PAK,) 4 MG tablet      follow package directions   21 tablet   0   . metoprolol tartrate (LOPRESSOR) 25 MG tablet   Oral   Take 25 mg by mouth 2 (two) times daily.           . metroNIDAZOLE (FLAGYL) 500 MG tablet   Oral   Take 1 tablet (500  mg total) by mouth 2 (two) times daily.   14 tablet   0   . oxyCODONE-acetaminophen (PERCOCET/ROXICET) 5-325 MG per tablet   Oral   Take 2 tablets by mouth every 4 (four) hours as needed for pain.   15 tablet   0   . Pheniramine-PE-APAP (THERAFLU COLD & SORE THROAT PO)   Oral   Take 1 packet by mouth once as needed.           . rosuvastatin (CRESTOR) 10 MG tablet   Oral   Take 10 mg by mouth daily.             BP 138/87  Pulse 93  Temp(Src) 98.2 F (36.8 C) (Oral)  Resp 20  Wt 272 lb (123.378 kg)  BMI 46.67 kg/m2  SpO2 100%  LMP 09/14/2012  Physical Exam  Constitutional: She is oriented to person, place, and time. She appears well-developed and well-nourished. No distress.  Morbidly obese  HENT:  Head: Normocephalic and atraumatic.  Mouth/Throat: Oropharynx is clear and moist. No oropharyngeal exudate.  Eyes:  Conjunctivae and EOM are normal. Pupils are equal, round, and reactive to light.  Neck: Normal range of motion. Neck supple.  Cardiovascular: Normal rate, regular rhythm and normal heart sounds.   No murmur heard. Pulmonary/Chest: Effort normal and breath sounds normal. No respiratory distress.  Abdominal: Soft. There is no tenderness. There is no rebound and no guarding.  Musculoskeletal: Normal range of motion. She exhibits tenderness. She exhibits no edema.  Left paraspinal lumbar tenderness 5/5 strength in bilateral lower extremities. Ankle plantar and dorsiflexion intact. Great toe extension intact bilaterally. +2 DP and PT pulses. +2 patellar reflexes bilaterally. Normal gait.   Neurological: She is alert and oriented to person, place, and time. No cranial nerve deficit. She exhibits normal muscle tone. Coordination normal.  Skin: Skin is warm.    ED Course  Procedures (including critical care time)  Labs Reviewed  URINALYSIS, ROUTINE W REFLEX MICROSCOPIC - Abnormal; Notable for the following:    APPearance CLOUDY (*)    All other components within normal limits  PREGNANCY, URINE   No results found.   1. Sciatica, left       MDM  Left-sided low back pain radiating down the left leg consistent with previous sciatica. No weakness, numbness or tingling. No neurological red flags. No signs of bowel or bladder incontinence.  Patient's history and exam consistent with sciatica. We'll treat with anti-inflammatories and pain medication. No evidence of cauda equina or cord compression on exam. She is ambulatory in the ED without assistance.      Glynn Octave, MD 10/05/12 [phone]

## 2012-10-05 NOTE — ED Notes (Signed)
Pt c/o constant low back, left hip and buttock pain that started Monday. Pt reports hx of sciatica.  Denies urinary symptoms.

## 2012-11-06 DIAGNOSIS — E785 Hyperlipidemia, unspecified: Secondary | ICD-10-CM | POA: Insufficient documentation

## 2013-04-17 DIAGNOSIS — Z9009 Acquired absence of other part of head and neck: Secondary | ICD-10-CM | POA: Insufficient documentation

## 2013-04-17 DIAGNOSIS — Z9889 Other specified postprocedural states: Secondary | ICD-10-CM | POA: Insufficient documentation

## 2013-04-17 DIAGNOSIS — Z98891 History of uterine scar from previous surgery: Secondary | ICD-10-CM | POA: Insufficient documentation

## 2013-04-17 DIAGNOSIS — D649 Anemia, unspecified: Secondary | ICD-10-CM | POA: Insufficient documentation

## 2013-04-17 DIAGNOSIS — Z96649 Presence of unspecified artificial hip joint: Secondary | ICD-10-CM | POA: Insufficient documentation

## 2013-08-07 DIAGNOSIS — N946 Dysmenorrhea, unspecified: Secondary | ICD-10-CM | POA: Insufficient documentation

## 2013-08-07 DIAGNOSIS — N926 Irregular menstruation, unspecified: Secondary | ICD-10-CM | POA: Insufficient documentation

## 2013-10-03 ENCOUNTER — Emergency Department (HOSPITAL_BASED_OUTPATIENT_CLINIC_OR_DEPARTMENT_OTHER): Payer: Medicaid Other

## 2013-10-03 ENCOUNTER — Emergency Department (HOSPITAL_BASED_OUTPATIENT_CLINIC_OR_DEPARTMENT_OTHER)
Admission: EM | Admit: 2013-10-03 | Discharge: 2013-10-03 | Disposition: A | Discharge status: Home / Self Care | Payer: Medicaid Other | Attending: Emergency Medicine | Admitting: Emergency Medicine

## 2013-10-03 ENCOUNTER — Encounter (HOSPITAL_BASED_OUTPATIENT_CLINIC_OR_DEPARTMENT_OTHER): Payer: Self-pay | Admitting: Emergency Medicine

## 2013-10-03 DIAGNOSIS — M25539 Pain in unspecified wrist: Secondary | ICD-10-CM

## 2013-10-03 DIAGNOSIS — Y929 Unspecified place or not applicable: Secondary | ICD-10-CM | POA: Insufficient documentation

## 2013-10-03 DIAGNOSIS — Z3202 Encounter for pregnancy test, result negative: Secondary | ICD-10-CM | POA: Insufficient documentation

## 2013-10-03 DIAGNOSIS — S8990XA Unspecified injury of unspecified lower leg, initial encounter: Secondary | ICD-10-CM | POA: Insufficient documentation

## 2013-10-03 DIAGNOSIS — Z9861 Coronary angioplasty status: Secondary | ICD-10-CM | POA: Insufficient documentation

## 2013-10-03 DIAGNOSIS — S59919A Unspecified injury of unspecified forearm, initial encounter: Secondary | ICD-10-CM

## 2013-10-03 DIAGNOSIS — R55 Syncope and collapse: Secondary | ICD-10-CM

## 2013-10-03 DIAGNOSIS — S59909A Unspecified injury of unspecified elbow, initial encounter: Secondary | ICD-10-CM | POA: Insufficient documentation

## 2013-10-03 DIAGNOSIS — S91109A Unspecified open wound of unspecified toe(s) without damage to nail, initial encounter: Secondary | ICD-10-CM | POA: Insufficient documentation

## 2013-10-03 DIAGNOSIS — Z791 Long term (current) use of non-steroidal anti-inflammatories (NSAID): Secondary | ICD-10-CM | POA: Insufficient documentation

## 2013-10-03 DIAGNOSIS — S99919A Unspecified injury of unspecified ankle, initial encounter: Principal | ICD-10-CM

## 2013-10-03 DIAGNOSIS — R296 Repeated falls: Secondary | ICD-10-CM | POA: Insufficient documentation

## 2013-10-03 DIAGNOSIS — S91119A Laceration without foreign body of unspecified toe without damage to nail, initial encounter: Secondary | ICD-10-CM

## 2013-10-03 DIAGNOSIS — Z79899 Other long term (current) drug therapy: Secondary | ICD-10-CM | POA: Insufficient documentation

## 2013-10-03 DIAGNOSIS — S6990XA Unspecified injury of unspecified wrist, hand and finger(s), initial encounter: Secondary | ICD-10-CM | POA: Insufficient documentation

## 2013-10-03 DIAGNOSIS — M25569 Pain in unspecified knee: Secondary | ICD-10-CM

## 2013-10-03 DIAGNOSIS — Y9389 Activity, other specified: Secondary | ICD-10-CM | POA: Insufficient documentation

## 2013-10-03 DIAGNOSIS — S99929A Unspecified injury of unspecified foot, initial encounter: Principal | ICD-10-CM

## 2013-10-03 DIAGNOSIS — G8929 Other chronic pain: Secondary | ICD-10-CM | POA: Insufficient documentation

## 2013-10-03 DIAGNOSIS — I10 Essential (primary) hypertension: Secondary | ICD-10-CM | POA: Insufficient documentation

## 2013-10-03 DIAGNOSIS — I2510 Atherosclerotic heart disease of native coronary artery without angina pectoris: Secondary | ICD-10-CM | POA: Insufficient documentation

## 2013-10-03 DIAGNOSIS — E7800 Pure hypercholesterolemia, unspecified: Secondary | ICD-10-CM | POA: Insufficient documentation

## 2013-10-03 HISTORY — DX: Morbid (severe) obesity due to excess calories: E66.01

## 2013-10-03 LAB — CBC WITH DIFFERENTIAL/PLATELET
BASOS PCT: 0 % (ref 0–1)
Basophils Absolute: 0 10*3/uL (ref 0.0–0.1)
EOS ABS: 0.2 10*3/uL (ref 0.0–0.7)
Eosinophils Relative: 2 % (ref 0–5)
HEMATOCRIT: 36.3 % (ref 36.0–46.0)
HEMOGLOBIN: 12.1 g/dL (ref 12.0–15.0)
LYMPHS ABS: 2.1 10*3/uL (ref 0.7–4.0)
Lymphocytes Relative: 31 % (ref 12–46)
MCH: 29.5 pg (ref 26.0–34.0)
MCHC: 33.3 g/dL (ref 30.0–36.0)
MCV: 88.5 fL (ref 78.0–100.0)
MONO ABS: 0.6 10*3/uL (ref 0.1–1.0)
MONOS PCT: 9 % (ref 3–12)
Neutro Abs: 3.9 10*3/uL (ref 1.7–7.7)
Neutrophils Relative %: 57 % (ref 43–77)
Platelets: 320 10*3/uL (ref 150–400)
RBC: 4.1 MIL/uL (ref 3.87–5.11)
RDW: 14.4 % (ref 11.5–15.5)
WBC: 6.8 10*3/uL (ref 4.0–10.5)

## 2013-10-03 LAB — URINALYSIS, ROUTINE W REFLEX MICROSCOPIC
BILIRUBIN URINE: NEGATIVE
GLUCOSE, UA: NEGATIVE mg/dL
KETONES UR: NEGATIVE mg/dL
Leukocytes, UA: NEGATIVE
Nitrite: NEGATIVE
PH: 5.5 (ref 5.0–8.0)
PROTEIN: NEGATIVE mg/dL
Specific Gravity, Urine: 1.015 (ref 1.005–1.030)
Urobilinogen, UA: 0.2 mg/dL (ref 0.0–1.0)

## 2013-10-03 LAB — I-STAT CHEM 8, ED
BUN: 13 mg/dL (ref 6–23)
CALCIUM ION: 1.17 mmol/L (ref 1.12–1.23)
CHLORIDE: 101 meq/L (ref 96–112)
CREATININE: 1.1 mg/dL (ref 0.50–1.10)
GLUCOSE: 93 mg/dL (ref 70–99)
HEMATOCRIT: 37 % (ref 36.0–46.0)
HEMOGLOBIN: 12.6 g/dL (ref 12.0–15.0)
Potassium: 4.1 mEq/L (ref 3.7–5.3)
Sodium: 139 mEq/L (ref 137–147)
TCO2: 24 mmol/L (ref 0–100)

## 2013-10-03 LAB — URINE MICROSCOPIC-ADD ON

## 2013-10-03 LAB — TROPONIN I: Troponin I: <0.3 ng/mL (ref <0.30)

## 2013-10-03 LAB — PREGNANCY, URINE: Preg Test, Ur: NEGATIVE

## 2013-10-03 MED ORDER — OXYCODONE-ACETAMINOPHEN 5-325 MG PO TABS: 1.0000 | ORAL_TABLET | ORAL | Status: DC | PRN

## 2013-10-03 MED ORDER — HYDROCODONE-ACETAMINOPHEN 5-325 MG PO TABS
2.0000 | ORAL_TABLET | Freq: Once | ORAL | Status: AC
  Administered 2013-10-03: 2 via ORAL
  Filled 2013-10-03: qty 2

## 2013-10-03 MED ORDER — ONDANSETRON 4 MG PO TBDP: 4.0000 mg | ORAL_TABLET | Freq: Three times a day (TID) | ORAL | Status: DC | PRN

## 2013-10-03 NOTE — ED Notes (Signed)
Syncope x 1, onset 1700 today, duration unknown. Nausea present initially but has resolved. Headache present 8/10, right temporary. Denied vision abnormality, ambulatory, no dizziness. knee pain "it's bruising".  Sinus rhythm, Vital sign stable

## 2013-10-03 NOTE — ED Notes (Signed)
Pt got oob this morning and "passed out" on the way to the bathroom. Out for unknown time. Sts when she woke up she was "sweaty and nauseated".  C/o Headache, right knee, right wrist and right foot pain. Pt is ambulatory without difficulty.

## 2013-10-03 NOTE — ED Provider Notes (Signed)
CSN: [id number]     Arrival date & time 10/03/13  1659 History   First MD Initiated Contact with Patient 10/03/13 1715     Chief Complaint  Patient presents with  . Fall     (Consider location/radiation/quality/duration/timing/severity/associated sxs/prior Treatment) HPI Comments: Pt states that she was getting out of bed this morning and she passed out. 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 She states that from the fall she has right foot, ankle and wrist pain. Denies cp or sob.denies similar history  The history is provided by the patient. No language interpreter was used.    Past Medical History  Diagnosis Date  . Chronic back pain   . Hypertension   . High cholesterol   . Thyroid disease   . Coronary artery disease   . Morbid obesity    Past Surgical History  Procedure Laterality Date  . Cesarean section    . Thyroid surgery    . Tonsillectomy    . Thyroid surgery    . Cesarean section    . Coronary angioplasty with stent placement    . Joint replacement    . Total hip arthroplasty    . Tubal ligation     No family history on file. History  Substance Use Topics  . Smoking status: Never Smoker   . Smokeless tobacco: Not on file  . Alcohol Use: No   OB History   Grav Para Term Preterm Abortions TAB SAB Ect Mult Living                 Review of Systems  Constitutional: Negative.   Respiratory: Negative.   Cardiovascular: Negative.       Allergies  Review of patient's allergies indicates no known allergies.  Home Medications   Current Outpatient Rx  Name  Route  Sig  Dispense  Refill  . metoprolol succinate (TOPROL-XL) 25 MG 24 hr tablet   Oral   Take 25 mg by mouth daily.         . pravastatin (PRAVACHOL) 20 MG tablet   Oral   Take 20 mg by mouth daily.         .  hydrochlorothiazide 25 MG tablet   Oral   Take 25 mg by mouth daily.           Marland Kitchen. HYDROcodone-acetaminophen (VICODIN) 5-500 MG per tablet   Oral   Take 1-2 tablets by mouth every 6 (six) hours as needed for pain.   15 tablet   0   . ibuprofen (ADVIL,MOTRIN) 800 MG tablet   Oral   Take 1 tablet (800 mg total) by mouth 3 (three) times daily.   21 tablet   0   . lisinopril (PRINIVIL,ZESTRIL) 10 MG tablet   Oral   Take 10 mg by mouth daily.           . methylPREDNISolone (MEDROL, PAK,) 4 MG tablet      follow package directions   21 tablet   0   . metoprolol tartrate (LOPRESSOR) 25 MG tablet   Oral   Take 25 mg by mouth 2 (two) times daily.           . metroNIDAZOLE (FLAGYL) 500 MG tablet   Oral   Take 1 tablet (  500 mg total) by mouth 2 (two) times daily.   14 tablet   0   . oxyCODONE-acetaminophen (PERCOCET/ROXICET) 5-325 MG per tablet   Oral   Take 2 tablets by mouth every 4 (four) hours as needed for pain.   15 tablet   0   . Pheniramine-PE-APAP (THERAFLU COLD & SORE THROAT PO)   Oral   Take 1 packet by mouth once as needed.           . rosuvastatin (CRESTOR) 10 MG tablet   Oral   Take 10 mg by mouth daily.            BP 142/94  Pulse 95  Temp(Src) 98.4 F (36.9 C) (Oral)  Resp 16  Ht 5\' 4"  (1.626 m)  Wt 275 lb (124.739 kg)  BMI 47.18 kg/m2  SpO2 98%  LMP 09/30/2013 Physical Exam  Nursing note and vitals reviewed. Constitutional: She is oriented to person, place, and time. She appears well-developed and well-nourished.  HENT:  Head: Normocephalic and atraumatic.  Right Ear: External ear normal.  Left Ear: External ear normal.  Cardiovascular: Normal rate and regular rhythm.   Pulmonary/Chest: Effort normal and breath sounds normal.  Abdominal: Soft. Bowel sounds are normal. There is no tenderness.  Musculoskeletal:       Cervical back: Normal.       Thoracic back: Normal.       Lumbar back: Normal.  Swelling or deformity noted to  right joints.  Neurological: She is alert and oriented to person, place, and time. Coordination normal.  Skin:  Superficial laceration to the bottom of the right toe.     ED Course  Procedures (including critical care time) Labs Review Labs Reviewed  URINALYSIS, ROUTINE W REFLEX MICROSCOPIC - Abnormal; Notable for the following:    Hgb urine dipstick LARGE (*)    All other components within normal limits  CBC WITH DIFFERENTIAL  TROPONIN I  PREGNANCY, URINE  URINE MICROSCOPIC-ADD ON  I-STAT CHEM 8, ED   Imaging Review Dg Wrist Complete Right  10/03/2013   CLINICAL DATA Fall with right wrist pain.  EXAM RIGHT WRIST - COMPLETE 3+ VIEW  COMPARISON None.  FINDINGS There is no evidence of fracture or dislocation. There is no evidence of arthropathy or other focal bone abnormality. Soft tissues are unremarkable.  IMPRESSION Negative.  SIGNATURE  Electronically Signed   By: Kennith Center M.D.   On: 10/03/2013 18:41   Ct Head Wo Contrast  10/03/2013   CLINICAL DATA Headache, syncope.  EXAM CT HEAD WITHOUT CONTRAST  TECHNIQUE Contiguous axial images were obtained from the base of the skull through the vertex without intravenous contrast.  COMPARISON None.  FINDINGS Bony calvarium is intact. No mass effect or midline shift is noted. Ventricular size is within normal limits. There is no evidence of mass lesion, hemorrhage or acute infarction.  IMPRESSION No gross intracranial abnormality seen.  SIGNATURE  Electronically Signed   By: Roque Lias M.D.   On: 10/03/2013 18:23   Dg Knee Complete 4 Views Right  10/03/2013   CLINICAL DATA Right knee pain with swelling.  EXAM RIGHT KNEE - COMPLETE 4+ VIEW  COMPARISON None.  FINDINGS Four views study shows no fracture. No subluxation or dislocation. Hypertrophic spurring is visible in all 3 compartments. No substantial joint effusion.  IMPRESSION Tricompartmental degenerative change without acute bony findings.  SIGNATURE  Electronically Signed   By: Kennith Center M.D.   On: 10/03/2013 18:40   Dg  Foot Complete Right  10/03/2013   CLINICAL DATA Fall.  EXAM RIGHT FOOT COMPLETE - 3+ VIEW  COMPARISON None.  FINDINGS No evidence for fracture. No subluxation or dislocation. No worrisome lytic or sclerotic osseous abnormality. There is soft tissue swelling in the fluid.  IMPRESSION No acute bony findings.  SIGNATURE  Electronically Signed   By: Kennith Center M.D.   On: 10/03/2013 18:40     EKG Interpretation   Date/Time:  Wednesday October 03 2013 17:23:06 EDT Ventricular Rate:  90 PR Interval:  184 QRS Duration: 72 QT Interval:  358 QTC Calculation: 437 R Axis:   33 Text Interpretation:  Normal sinus rhythm Anterior infarct , age  undetermined Abnormal ECG since last tracing no significant change  Confirmed by BELFI  MD, MELANIE ([id number]) on 10/03/2013 5:55:45 PM      MDM   Final diagnoses:  Syncope  Toe laceration  Knee pain  Wrist pain    Hematuria related to pt being on menstrual cycle.  No injury noted on imaging. Will send home with something for pain. Discussed with pt follow up if happens again    Teressa Lower, NP 10/03/13 1919

## 2013-10-03 NOTE — ED Provider Notes (Signed)
Medical screening examination/treatment/procedure(s) were performed by non-physician practitioner and as supervising physician I was immediately available for consultation/collaboration.   EKG Interpretation   Date/Time:  Wednesday October 03 2013 17:23:06 EDT Ventricular Rate:  90 PR Interval:  184 QRS Duration: 72 QT Interval:  358 QTC Calculation: 437 R Axis:   33 Text Interpretation:  Normal sinus rhythm Anterior infarct , age  undetermined Abnormal ECG since last tracing no significant change  Confirmed by Jaylynn Mcaleer  MD, Marvelle Span ([id number]) on 10/03/2013 5:55:45 PM        Rolan BuccoMelanie Brittnee Gaetano, MD 10/03/13 1924

## 2013-10-03 NOTE — Discharge Instructions (Signed)
Syncope °Syncope means a person passes out (faints). The person usually wakes up in less than 5 minutes. It is important to seek medical care for syncope. °HOME CARE °· Have someone stay with you until you feel normal. °· Do not drive, use machines, or play sports until your doctor says it is okay. °· Keep all doctor visits as told. °· Lie down when you feel like you might pass out. Take deep breaths. Wait until you feel normal before standing up. °· Drink enough fluids to keep your pee (urine) clear or pale yellow. °· If you take blood pressure or heart medicine, get up slowly. Take several minutes to sit and then stand. °GET HELP RIGHT AWAY IF:  °· You have a severe headache. °· You have pain in the chest, belly (abdomen), or back. °· You are bleeding from the mouth or butt (rectum). °· You have black or tarry poop (stool). °· You have an irregular or very fast heartbeat. °· You have pain with breathing. °· You keep passing out, or you have shaking (seizures) when you pass out. °· You pass out when sitting or lying down. °· You feel confused. °· You have trouble walking. °· You have severe weakness. °· You have vision problems. °If you fainted, call your local emergency services (911 in [place] Do not drive yourself to the hospital. °MAKE SURE YOU:  °· Understand these instructions. °· Will watch your condition. °· Will get help right away if you are not doing well or get worse. °Document Released: 12/29/2007 Document Revised: 01/11/2012 Document Reviewed: 09/10/2011 °ExitCare® Patient Information ©2014 [employer]. ° °

## 2014-05-28 DIAGNOSIS — I2510 Atherosclerotic heart disease of native coronary artery without angina pectoris: Secondary | ICD-10-CM | POA: Insufficient documentation

## 2014-11-02 ENCOUNTER — Emergency Department (HOSPITAL_BASED_OUTPATIENT_CLINIC_OR_DEPARTMENT_OTHER)
Admission: EM | Admit: 2014-11-02 | Discharge: 2014-11-02 | Disposition: A | Discharge status: Home / Self Care | Payer: BLUE CROSS/BLUE SHIELD | Attending: Emergency Medicine | Admitting: Emergency Medicine

## 2014-11-02 ENCOUNTER — Emergency Department (HOSPITAL_BASED_OUTPATIENT_CLINIC_OR_DEPARTMENT_OTHER): Payer: BLUE CROSS/BLUE SHIELD

## 2014-11-02 ENCOUNTER — Encounter (HOSPITAL_BASED_OUTPATIENT_CLINIC_OR_DEPARTMENT_OTHER): Payer: Self-pay

## 2014-11-02 DIAGNOSIS — G8929 Other chronic pain: Secondary | ICD-10-CM | POA: Diagnosis not present

## 2014-11-02 DIAGNOSIS — Z792 Long term (current) use of antibiotics: Secondary | ICD-10-CM | POA: Diagnosis not present

## 2014-11-02 DIAGNOSIS — Z791 Long term (current) use of non-steroidal anti-inflammatories (NSAID): Secondary | ICD-10-CM | POA: Insufficient documentation

## 2014-11-02 DIAGNOSIS — E780 Pure hypercholesterolemia: Secondary | ICD-10-CM | POA: Diagnosis not present

## 2014-11-02 DIAGNOSIS — M729 Fibroblastic disorder, unspecified: Secondary | ICD-10-CM | POA: Insufficient documentation

## 2014-11-02 DIAGNOSIS — Z79899 Other long term (current) drug therapy: Secondary | ICD-10-CM | POA: Diagnosis not present

## 2014-11-02 DIAGNOSIS — Z9861 Coronary angioplasty status: Secondary | ICD-10-CM | POA: Diagnosis not present

## 2014-11-02 DIAGNOSIS — I10 Essential (primary) hypertension: Secondary | ICD-10-CM | POA: Diagnosis not present

## 2014-11-02 DIAGNOSIS — M79672 Pain in left foot: Secondary | ICD-10-CM | POA: Diagnosis present

## 2014-11-02 MED ORDER — ACETAMINOPHEN 500 MG PO TABS
1000.0000 mg | ORAL_TABLET | Freq: Once | ORAL | Status: AC
  Administered 2014-11-02: 1000 mg via ORAL
  Filled 2014-11-02: qty 2

## 2014-11-02 NOTE — ED Notes (Signed)
Pt reports 2 days of pain in right bottom of foot near 4th/5th digits.  No known injury.

## 2014-11-02 NOTE — Discharge Instructions (Signed)
Plantar Fasciitis  Plantar fasciitis is a common condition that causes foot pain. It is soreness (inflammation) of the band of tough fibrous tissue on the bottom of the foot that runs from the heel bone (calcaneus) to the ball of the foot. The cause of this soreness may be from excessive standing, poor fitting shoes, running on hard surfaces, being overweight, having an abnormal walk, or overuse (this is common in runners) of the painful foot or feet. It is also common in aerobic exercise dancers and ballet dancers.  SYMPTOMS   Most people with plantar fasciitis complain of:   Severe pain in the morning on the bottom of their foot especially when taking the first steps out of bed. This pain recedes after a few minutes of walking.   Severe pain is experienced also during walking following a long period of inactivity.   Pain is worse when walking barefoot or up stairs  DIAGNOSIS    Your caregiver will diagnose this condition by examining and feeling your foot.   Special tests such as X-rays of your foot, are usually not needed.  PREVENTION    Consult a sports medicine professional before beginning a new exercise program.   Walking programs offer a good workout. With walking there is a lower chance of overuse injuries common to runners. There is less impact and less jarring of the joints.   Begin all new exercise programs slowly. If problems or pain develop, decrease the amount of time or distance until you are at a comfortable level.   Wear good shoes and replace them regularly.   Stretch your foot and the heel cords at the back of the ankle (Achilles tendon) both before and after exercise.   Run or exercise on even surfaces that are not hard. For example, asphalt is better than pavement.   Do not run barefoot on hard surfaces.   If using a treadmill, vary the incline.   Do not continue to workout if you have foot or joint problems. Seek professional help if they do not improve.  HOME CARE INSTRUCTIONS     Avoid activities that cause you pain until you recover.   Use ice or cold packs on the problem or painful areas after working out.   Only take over-the-counter or prescription medicines for pain, discomfort, or fever as directed by your caregiver.   Soft shoe inserts or athletic shoes with air or gel sole cushions may be helpful.   If problems continue or become more severe, consult a sports medicine caregiver or your own health care provider. Cortisone is a potent anti-inflammatory medication that may be injected into the painful area. You can discuss this treatment with your caregiver.  MAKE SURE YOU:    Understand these instructions.   Will watch your condition.   Will get help right away if you are not doing well or get worse.  Document Released: 04/06/2001 Document Revised: 10/04/2011 Document Reviewed: 06/05/2008  ExitCare Patient Information 2015 [employer]. This information is not intended to replace advice given to you by your health care provider. Make sure you discuss any questions you have with your health care provider.

## 2014-11-02 NOTE — ED Notes (Signed)
Good Plantar and Dorsal Flexion of lt foot. Pain increases with ambulation, states difficult to walk

## 2014-11-02 NOTE — ED Provider Notes (Signed)
CSN: [id number]     Arrival date & time 11/02/14  1710 History  This chart was scribed for Arby Barrette, MD by Tonye Royalty, ED Scribe. This patient was seen in room MH02/MH02 and the patient's care was started at 6:13 PM.    Chief Complaint  Patient presents with  . Foot Pain   The history is provided by the patient. No language interpreter was used.    HPI Comments: Amanda Nielsen is a 44 y.o. female who presents to the Emergency Department complaining of pain to left foot with onset 2 days ago, worse upon waking yesterday. She locates it to the bottom of the foot near the 4th and 5th digits. She reports pain with walking. She states it does not hurt when there is no pressure on it. 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 She denies falls, injuries, or unusual exertion. She denies pain in ankle or lower leg.   Past Medical History  Diagnosis Date  . Chronic back pain   . Hypertension   . High cholesterol   . Thyroid disease   . Coronary artery disease   . Morbid obesity    Past Surgical History  Procedure Laterality Date  . Cesarean section    . Thyroid surgery    . Tonsillectomy    . Thyroid surgery    . Cesarean section    . Coronary angioplasty with stent placement    . Joint replacement    . Total hip arthroplasty    . Tubal ligation     No family history on file. History  Substance Use Topics  . Smoking status: Never Smoker   . Smokeless tobacco: Not on file  . Alcohol Use: No   OB History    No data available     Review of Systems  Constitutional: Negative for fever and chills.  Respiratory: Negative for shortness of breath.   Musculoskeletal:       Left foot pain     Allergies  Review of patient's allergies indicates no known allergies.  Home Medications   Prior to Admission medications   Medication Sig Start Date End Date Taking? Authorizing Provider  lovastatin  (MEVACOR) 10 MG tablet Take 10 mg by mouth at bedtime.   Yes Historical Provider, MD  hydrochlorothiazide 25 MG tablet Take 25 mg by mouth daily.      Historical Provider, MD  HYDROcodone-acetaminophen (VICODIN) 5-500 MG per tablet Take 1-2 tablets by mouth every 6 (six) hours as needed for pain. 05/05/12   Rolan Bucco, MD  ibuprofen (ADVIL,MOTRIN) 800 MG tablet Take 1 tablet (800 mg total) by mouth 3 (three) times daily. 10/05/12   Glynn Octave, MD  lisinopril (PRINIVIL,ZESTRIL) 10 MG tablet Take 10 mg by mouth daily.      Historical Provider, MD  methylPREDNISolone (MEDROL, PAK,) 4 MG tablet follow package directions 10/05/12   Glynn Octave, MD  metoprolol succinate (TOPROL-XL) 25 MG 24 hr tablet Take 25 mg by mouth daily.    Historical Provider, MD  metoprolol tartrate (LOPRESSOR) 25 MG tablet Take 25 mg by mouth 2 (two) times daily.      Historical Provider, MD  metroNIDAZOLE (FLAGYL) 500 MG tablet Take 1 tablet (500 mg total) by mouth 2 (two) times daily. 05/05/12   Rolan Bucco, MD  ondansetron (ZOFRAN ODT) 4 MG disintegrating tablet Take 1 tablet (4 mg total) by mouth every 8 (  eight) hours as needed for nausea or vomiting. 10/03/13   Teressa LowerVrinda Pickering, NP  oxyCODONE-acetaminophen (PERCOCET/ROXICET) 5-325 MG per tablet Take 2 tablets by mouth every 4 (four) hours as needed for pain. 10/05/12   Glynn OctaveStephen Rancour, MD  oxyCODONE-acetaminophen (PERCOCET/ROXICET) 5-325 MG per tablet Take 1-2 tablets by mouth every 4 (four) hours as needed for severe pain. 10/03/13   Teressa LowerVrinda Pickering, NP  Pheniramine-PE-APAP (THERAFLU COLD & SORE THROAT PO) Take 1 packet by mouth once as needed.      Historical Provider, MD  pravastatin (PRAVACHOL) 20 MG tablet Take 20 mg by mouth daily.    Historical Provider, MD  rosuvastatin (CRESTOR) 10 MG tablet Take 10 mg by mouth daily.      Historical Provider, MD   BP 127/75 mmHg  Pulse 87  Temp(Src) 99.3 F (37.4 C) (Oral)  Resp 20  Ht 5\' 6"  (1.676 m)  Wt 274 lb  (124.286 kg)  BMI 44.25 kg/m2  SpO2 99%  LMP 04/25/2014 Physical Exam  Constitutional:  Patient is morbidly obese. She is alert and nontoxic. Well appearance.  HENT:  Head: Normocephalic and atraumatic.  Eyes: EOM are normal.  Pulmonary/Chest: Effort normal.  Musculoskeletal: Normal range of motion. She exhibits no edema.  Both lower extremities are symmetric in appearance. There is no peripheral edema or asymmetric swelling. Skin condition is excellent. To palpation, the patient has no pain or effusion of the left knee, range of motion is normal. No calf pain or pretibial pain to palpation. The foot has normal symmetric contours. There are no skin lesions. There is no erythema. The patient does endorse tenderness to deep palpation over the distal metatarsal areas of the third through the fifth digits. She is not tender at the base of the digits or the digits themselves. She has some compression tenderness to the forefoot. No compression tenderness to the heel. No point tenderness over the bony prominences of the ankle.    ED Course  Procedures (including critical care time)  DIAGNOSTIC STUDIES: Oxygen Saturation is 99% on room air, normal by my interpretation.    COORDINATION OF CARE: 6:17 PM Discussed treatment plan with patient at beside, including post-op shoe, elevation, Tylenol or Ibuprofen for pain, and follow up to podiatrist. The patient agrees with the plan and has no further questions at this time.   Labs Review Labs Reviewed - No data to display  Imaging Review Dg Foot Complete Left  11/02/2014   CLINICAL DATA:  Acute left foot pain for 2 days without known injury. Initial encounter.  EXAM: LEFT FOOT - COMPLETE 3+ VIEW  COMPARISON:  None.  FINDINGS: There is no evidence of fracture or dislocation. There is no evidence of arthropathy or other focal bone abnormality. Soft tissues are unremarkable.  IMPRESSION: Normal left foot.   Electronically Signed   By: Lupita RaiderJames  Green Jr,  M.D.   On: 11/02/2014 17:48     EKG Interpretation None      MDM   Final diagnoses:  Fasciitis   pain most likely to mechanical, repetitive type injury. I suspect fasciitis of the forefoot. There is no evidence of infection or acute injury.  Arby BarretteMarcy Eddy Termine, MD 11/02/14 [phone]

## 2014-11-02 NOTE — ED Notes (Signed)
44yo female presents to ED w/ lt foot pain, began this past Thursday, denies any injury to Lt foot

## 2014-11-13 DIAGNOSIS — K219 Gastro-esophageal reflux disease without esophagitis: Secondary | ICD-10-CM | POA: Insufficient documentation

## 2015-03-03 DIAGNOSIS — Z9884 Bariatric surgery status: Secondary | ICD-10-CM | POA: Insufficient documentation

## 2015-03-18 DIAGNOSIS — I10 Essential (primary) hypertension: Secondary | ICD-10-CM | POA: Insufficient documentation

## 2015-03-24 ENCOUNTER — Encounter (HOSPITAL_BASED_OUTPATIENT_CLINIC_OR_DEPARTMENT_OTHER): Payer: Self-pay | Admitting: *Deleted

## 2015-03-24 ENCOUNTER — Emergency Department (HOSPITAL_BASED_OUTPATIENT_CLINIC_OR_DEPARTMENT_OTHER): Payer: BLUE CROSS/BLUE SHIELD

## 2015-03-24 ENCOUNTER — Emergency Department (HOSPITAL_BASED_OUTPATIENT_CLINIC_OR_DEPARTMENT_OTHER)
Admission: EM | Admit: 2015-03-24 | Discharge: 2015-03-24 | Disposition: A | Discharge status: Home / Self Care | Payer: BLUE CROSS/BLUE SHIELD | Attending: Emergency Medicine | Admitting: Emergency Medicine

## 2015-03-24 DIAGNOSIS — M779 Enthesopathy, unspecified: Secondary | ICD-10-CM | POA: Diagnosis not present

## 2015-03-24 DIAGNOSIS — G8929 Other chronic pain: Secondary | ICD-10-CM | POA: Diagnosis not present

## 2015-03-24 DIAGNOSIS — I2510 Atherosclerotic heart disease of native coronary artery without angina pectoris: Secondary | ICD-10-CM | POA: Diagnosis not present

## 2015-03-24 DIAGNOSIS — M79672 Pain in left foot: Secondary | ICD-10-CM

## 2015-03-24 DIAGNOSIS — I10 Essential (primary) hypertension: Secondary | ICD-10-CM | POA: Diagnosis not present

## 2015-03-24 DIAGNOSIS — Z79899 Other long term (current) drug therapy: Secondary | ICD-10-CM | POA: Diagnosis not present

## 2015-03-24 DIAGNOSIS — E780 Pure hypercholesterolemia: Secondary | ICD-10-CM | POA: Diagnosis not present

## 2015-03-24 MED ORDER — TRAMADOL HCL 50 MG PO TABS: 50.0000 mg | ORAL_TABLET | Freq: Four times a day (QID) | ORAL | Status: DC | PRN

## 2015-03-24 NOTE — ED Notes (Signed)
MD at bedside to discuss results of testing. 

## 2015-03-24 NOTE — ED Provider Notes (Signed)
CSN: [id number]     Arrival date & time 03/24/15  1120 History   First MD Initiated Contact with Patient 03/24/15 1157     Chief Complaint  Patient presents with  . Foot Pain     (Consider location/radiation/quality/duration/timing/severity/associated sxs/prior Treatment) HPI Comments: Patient presents with pain to her left foot. She states it's hurt since yesterday. She denies any known injury. She states it hurts worse when she is ambulating. She denies any swelling to her leg. She denies any numbness or weakness in the foot. She has a history of recent gastric bypass surgery 2 weeks ago. She denies any pain to her calf or lower leg. She is not taking anything at home for the pain.  Patient is a 44 y.o. female presenting with lower extremity pain.  Foot Pain Pertinent negatives include no headaches.    Past Medical History  Diagnosis Date  . Chronic back pain   . Hypertension   . High cholesterol   . Thyroid disease   . Coronary artery disease   . Morbid obesity    Past Surgical History  Procedure Laterality Date  . Cesarean section    . Thyroid surgery    . Tonsillectomy    . Thyroid surgery    . Cesarean section    . Coronary angioplasty with stent placement    . Joint replacement    . Total hip arthroplasty    . Tubal ligation    . Gastric bypass     No family history on file. Social History  Substance Use Topics  . Smoking status: Never Smoker   . Smokeless tobacco: None  . Alcohol Use: No   OB History    No data available     Review of Systems  Constitutional: Negative for fever.  Gastrointestinal: Negative for nausea and vomiting.  Musculoskeletal: Positive for arthralgias. Negative for back pain, joint swelling and neck pain.  Skin: Negative for wound.  Neurological: Negative for weakness, numbness and headaches.      Allergies  Review of patient's allergies indicates no known allergies.  Home Medications   Prior to Admission medications    Medication Sig Start Date End Date Taking? Authorizing Provider  hydrochlorothiazide 25 MG tablet Take 25 mg by mouth daily.      Historical Provider, MD  HYDROcodone-acetaminophen (VICODIN) 5-500 MG per tablet Take 1-2 tablets by mouth every 6 (six) hours as needed for pain. 05/05/12   Rolan Bucco, MD  lisinopril (PRINIVIL,ZESTRIL) 10 MG tablet Take 10 mg by mouth daily.      Historical Provider, MD  lovastatin (MEVACOR) 10 MG tablet Take 10 mg by mouth at bedtime.    Historical Provider, MD  methylPREDNISolone (MEDROL, PAK,) 4 MG tablet follow package directions 10/05/12   Glynn Octave, MD  metoprolol succinate (TOPROL-XL) 25 MG 24 hr tablet Take 25 mg by mouth daily.    Historical Provider, MD  metoprolol tartrate (LOPRESSOR) 25 MG tablet Take 25 mg by mouth 2 (two) times daily.      Historical Provider, MD  metroNIDAZOLE (FLAGYL) 500 MG tablet Take 1 tablet (500 mg total) by mouth 2 (two) times daily. 05/05/12   Rolan Bucco, MD  ondansetron (ZOFRAN ODT) 4 MG disintegrating tablet Take 1 tablet (4 mg total) by mouth every 8 (eight) hours as needed for nausea or vomiting. 10/03/13   Teressa Lower, NP  oxyCODONE-acetaminophen (PERCOCET/ROXICET) 5-325 MG per tablet Take 2 tablets by mouth every 4 (four) hours as needed for pain. 10/05/12  Glynn Octave, MD  oxyCODONE-acetaminophen (PERCOCET/ROXICET) 5-325 MG per tablet Take 1-2 tablets by mouth every 4 (four) hours as needed for severe pain. 10/03/13   Teressa Lower, NP  Pheniramine-PE-APAP (THERAFLU COLD & SORE THROAT PO) Take 1 packet by mouth once as needed.      Historical Provider, MD  pravastatin (PRAVACHOL) 20 MG tablet Take 20 mg by mouth daily.    Historical Provider, MD  rosuvastatin (CRESTOR) 10 MG tablet Take 10 mg by mouth daily.      Historical Provider, MD  traMADol (ULTRAM) 50 MG tablet Take 1 tablet (50 mg total) by mouth every 6 (six) hours as needed. 03/24/15   Rolan Bucco, MD   BP 128/66 mmHg  Pulse 98  Temp(Src)  98.7 F (37.1 C) (Oral)  Resp 18  Ht  (1.626 m)  Wt 250 lb (113.399 kg)  BMI 42.89 kg/m2  SpO2 100% Physical Exam  Constitutional: She is oriented to person, place, and time. She appears well-developed and well-nourished.  HENT:  Head: Normocephalic and atraumatic.  Neck: Normal range of motion. Neck supple.  Cardiovascular: Normal rate.   Pulmonary/Chest: Effort normal.  Musculoskeletal: She exhibits edema and tenderness.  Patient has tenderness and some very mild edema over the first metatarsal bone of the left foot. There is no warmth or redness. No pain to the ankle. There is no pain to the lower leg. No swelling of the calf or the remainder of the foot. Pedal pulses are intact. She has normal sensation and motor function in the foot.  Neurological: She is alert and oriented to person, place, and time.  Skin: Skin is warm and dry.  Psychiatric: She has a normal mood and affect.    ED Course  Procedures (including critical care time) Labs Review Labs Reviewed - No data to display  Imaging Review Dg Foot Complete Left  03/24/2015   CLINICAL DATA:  Left foot pain for 2 days with no injury.  EXAM: LEFT FOOT - COMPLETE 3+ VIEW  COMPARISON:  November 02, 2014  FINDINGS: There is no evidence of fracture or dislocation. Calcification at the Achilles tendon insertion to the calcaneus is noted. Soft tissues are unremarkable.  IMPRESSION: No acute fracture or dislocation   Electronically Signed   By: Sherian Rein M.D.   On: 03/24/2015 13:05   I have personally reviewed and evaluated these images and lab results as part of my medical decision-making.   EKG Interpretation None      MDM   Final diagnoses:  Left foot pain  Tendonitis    No fractures are identified. I don't see any evidence of infection. This could represent some tendinitis. She's tender mostly along the first metatarsal. There is no significant tenderness to the plantar fascia. She was placed in a postop shoe. She  was advised in ice and elevation. She was not prescribed NSAIDs given her recent gastric bypass surgery. She was given a prescription for tramadol. She was given a referral to follow-up with Dr. Pearletha Forge.    Rolan Bucco, MD 03/24/15 1325

## 2015-03-24 NOTE — ED Notes (Addendum)
Left foot pain woke her up last night. No injury. Gastric bypass 2 weeks ago.

## 2015-03-24 NOTE — Discharge Instructions (Signed)

## 2015-04-27 ENCOUNTER — Emergency Department (HOSPITAL_BASED_OUTPATIENT_CLINIC_OR_DEPARTMENT_OTHER)
Admission: EM | Admit: 2015-04-27 | Discharge: 2015-04-27 | Disposition: A | Discharge status: Home / Self Care | Payer: BLUE CROSS/BLUE SHIELD | Attending: Physician Assistant | Admitting: Physician Assistant

## 2015-04-27 ENCOUNTER — Emergency Department (HOSPITAL_BASED_OUTPATIENT_CLINIC_OR_DEPARTMENT_OTHER): Payer: BLUE CROSS/BLUE SHIELD

## 2015-04-27 DIAGNOSIS — Y9289 Other specified places as the place of occurrence of the external cause: Secondary | ICD-10-CM | POA: Insufficient documentation

## 2015-04-27 DIAGNOSIS — Y998 Other external cause status: Secondary | ICD-10-CM | POA: Insufficient documentation

## 2015-04-27 DIAGNOSIS — I2510 Atherosclerotic heart disease of native coronary artery without angina pectoris: Secondary | ICD-10-CM | POA: Insufficient documentation

## 2015-04-27 DIAGNOSIS — X58XXXA Exposure to other specified factors, initial encounter: Secondary | ICD-10-CM | POA: Diagnosis not present

## 2015-04-27 DIAGNOSIS — Y9389 Activity, other specified: Secondary | ICD-10-CM | POA: Insufficient documentation

## 2015-04-27 DIAGNOSIS — I10 Essential (primary) hypertension: Secondary | ICD-10-CM | POA: Diagnosis not present

## 2015-04-27 DIAGNOSIS — Z79899 Other long term (current) drug therapy: Secondary | ICD-10-CM | POA: Diagnosis not present

## 2015-04-27 DIAGNOSIS — G8929 Other chronic pain: Secondary | ICD-10-CM | POA: Diagnosis not present

## 2015-04-27 DIAGNOSIS — S99911A Unspecified injury of right ankle, initial encounter: Secondary | ICD-10-CM | POA: Diagnosis present

## 2015-04-27 DIAGNOSIS — S8261XA Displaced fracture of lateral malleolus of right fibula, initial encounter for closed fracture: Secondary | ICD-10-CM | POA: Insufficient documentation

## 2015-04-27 MED ORDER — OXYCODONE-ACETAMINOPHEN 5-325 MG PO TABS: 1.0000 | ORAL_TABLET | Freq: Four times a day (QID) | ORAL | Status: DC | PRN

## 2015-04-27 MED ORDER — OXYCODONE-ACETAMINOPHEN 5-325 MG PO TABS
1.0000 | ORAL_TABLET | Freq: Once | ORAL | Status: AC
  Administered 2015-04-27: 1 via ORAL
  Filled 2015-04-27: qty 1

## 2015-04-27 NOTE — ED Notes (Signed)
Patient here with right ankle pain pain since awakening this am, pain with any ambulation

## 2015-04-27 NOTE — ED Provider Notes (Signed)
CSN: [id number]     Arrival date & time 04/27/15  1846 History  By signing my name below, I, Terrance Branch, attest that this documentation has been prepared under the direction and in the presence of Courteney Randall An, MD. Electronically Signed: Evon Slack, ED Scribe. 04/27/2015. 10:22 PM.      Chief Complaint  Patient presents with  . Ankle Pain    The history is provided by the patient. No language interpreter was used.   HPI Comments: Skyann Ganim is a 44 y.o. female who presents to the Emergency Department complaining of right ankle pain onset this morning. Pt states she hit the injury but cant remember on what. Pt has associated swelling. Pt states that the pain is worse when ambulating or bearing weight. Pt denies any medication PTA. Pt denies numbness tingling.     Past Medical History  Diagnosis Date  . Chronic back pain   . Hypertension   . High cholesterol   . Thyroid disease   . Coronary artery disease   . Morbid obesity    Past Surgical History  Procedure Laterality Date  . Cesarean section    . Thyroid surgery    . Tonsillectomy    . Thyroid surgery    . Cesarean section    . Coronary angioplasty with stent placement    . Joint replacement    . Total hip arthroplasty    . Tubal ligation    . Gastric bypass     No family history on file. Social History  Substance Use Topics  . Smoking status: Never Smoker   . Smokeless tobacco: Not on file  . Alcohol Use: No   OB History    No data available     Review of Systems  Musculoskeletal: Positive for joint swelling and arthralgias.  Neurological: Negative for numbness.  All other systems reviewed and are negative.     Allergies  Review of patient's allergies indicates no known allergies.  Home Medications   Prior to Admission medications   Medication Sig Start Date End Date Taking? Authorizing Provider  lisinopril (PRINIVIL,ZESTRIL) 10 MG tablet Take 10 mg by mouth daily.       Historical Provider, MD  metoprolol succinate (TOPROL-XL) 25 MG 24 hr tablet Take 25 mg by mouth daily.    Historical Provider, MD  metoprolol tartrate (LOPRESSOR) 25 MG tablet Take 25 mg by mouth 2 (two) times daily.      Historical Provider, MD  ondansetron (ZOFRAN ODT) 4 MG disintegrating tablet Take 1 tablet (4 mg total) by mouth every 8 (eight) hours as needed for nausea or vomiting. 10/03/13   Teressa Lower, NP  Pheniramine-PE-APAP [facility] COLD & SORE THROAT PO) Take 1 packet by mouth once as needed.      Historical Provider, MD  traMADol (ULTRAM) 50 MG tablet Take 1 tablet (50 mg total) by mouth every 6 (six) hours as needed. 03/24/15   Rolan Bucco, MD   BP 115/80 mmHg  Pulse 101  Temp(Src) 98.3 F (36.8 C)  Resp 18  SpO2 97%   Physical Exam  Constitutional: She is oriented to person, place, and time. She appears well-developed and well-nourished. No distress.  HENT:  Head: Normocephalic and atraumatic.  Eyes: Conjunctivae and EOM are normal.  Neck: Neck supple. No tracheal deviation present.  Cardiovascular: Normal rate.   Pulmonary/Chest: Effort normal. No respiratory distress.  Musculoskeletal: Normal range of motion. She exhibits tenderness.  Swelling and tenderness to the lateral malleolus, slight  swelling to dorsum of foot, good ROM. Distal pulses intact   Neurological: She is alert and oriented to person, place, and time.  Skin: Skin is warm and dry.  Psychiatric: She has a normal mood and affect. Her behavior is normal.  Nursing note and vitals reviewed.   ED Course  Procedures (including critical care time) DIAGNOSTIC STUDIES: Oxygen Saturation is 97% on RA, normal by my interpretation.    COORDINATION OF CARE: 10:23 PM-Discussed treatment plan with pt at bedside and pt agreed to plan.     Labs Review Labs Reviewed - No data to display  Imaging Review Dg Ankle Complete Right  04/27/2015   CLINICAL DATA:  Right ankle pain for 1 day, swelling over the  lateral ankle  EXAM: RIGHT ANKLE - COMPLETE 3+ VIEW  COMPARISON:  Right foot 10/03/2013  FINDINGS: There is a small avulsion fracture from the tip of the lateral malleolus with overlying soft tissue swelling. There is no other fracture or dislocation. The ankle mortise is intact. 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  IMPRESSION: Small avulsion fracture from the tip of the lateral malleolus with overlying soft tissue swelling.   Electronically Signed   By: Elige Ko   On: 04/27/2015 20:01      EKG Interpretation None      MDM   Final diagnoses:  None    Patient is a 44 year old female presenting of right ankle pain after hitting it against something.  She has small avulsion fracture of the lateral malleolus.  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 Patient cannot take ibuprofen because of gastric bypass surgery.  Patient has intact pulses. We'll have her follow-up with orthopedics.  I personally performed the services described in this documentation, which was scribed in my presence. The recorded information has been reviewed and is accurate.       Courteney Randall An, MD 04/27/15 2231

## 2015-04-27 NOTE — ED Notes (Signed)
Spoke with Dr. Corlis Leak in regards to splint on patient's right ankle. Dr. Corlis Leak wanted a posterior short leg and a stirrup on this patient to prevent movement in all directions.

## 2015-04-27 NOTE — Discharge Instructions (Signed)
Ankle Fracture  A fracture is a break in a bone. The ankle joint is made up of three bones. These include the lower (distal)sections of your lower leg bones, called the tibia and fibula, along with a bone in your foot, called the talus. Depending on how bad the break is and if more than one ankle joint bone is broken, a cast or splint is used to protect and keep your injured bone from moving while it heals. Sometimes, surgery is required to help the fracture heal properly.   There are two general types of fractures:   Stable fracture. This includes a single fracture line through one bone, with no injury to ankle ligaments. A fracture of the talus that does not have any displacement (movement of the bone on either side of the fracture line) is also stable.   Unstable fracture. This includes more than one fracture line through one or more bones in the ankle joint. It also includes fractures that have displacement of the bone on either side of the fracture line.  CAUSES   A direct blow to the ankle.    Quickly and severely twisting your ankle.   Trauma, such as a car accident or falling from a significant height.  RISK FACTORS  You may be at a higher risk of ankle fracture if:   You have certain medical conditions.   You are involved in high-impact sports.   You are involved in a high-impact car accident.  SIGNS AND SYMPTOMS    Tender and swollen ankle.   Bruising around the injured ankle.   Pain on movement of the ankle.   Difficulty walking or putting weight on the ankle.   A cold foot below the site of the ankle injury. This can occur if the blood vessels passing through your injured ankle were also damaged.   Numbness in the foot below the site of the ankle injury.  DIAGNOSIS   An ankle fracture is usually diagnosed with a physical exam and X-rays. A CT scan may also be required for complex fractures.  TREATMENT   Stable fractures are treated with a cast or splint and using crutches to avoid putting  weight on your injured ankle. This is followed by an ankle strengthening program. Some patients require a special type of cast, depending on other medical problems they may have. Unstable fractures require surgery to ensure the bones heal properly. Your health care provider will tell you what type of fracture you have and the best treatment for your condition.  HOME CARE INSTRUCTIONS    Review correct crutch use with your health care provider and use your crutches as directed. Safe use of crutches is extremely important. Misuse of crutches can cause you to fall or cause injury to nerves in your hands or armpits.   Do not put weight or pressure on the injured ankle until directed by your health care provider.   To lessen the swelling, keep the injured leg elevated while sitting or lying down.   Apply ice to the injured area:   Put ice in a plastic bag.   Place a towel between your cast and the bag.   Leave the ice on for 20 minutes, 2-3 times a day.   If you have a plaster or fiberglass cast:   Do not try to scratch the skin under the cast with any objects. This can increase your risk of skin infection.   Check the skin around the cast every day. You   may put lotion on any red or sore areas.   Keep your cast dry and clean.   If you have a plaster splint:   Wear the splint as directed.   You may loosen the elastic around the splint if your toes become numb, tingle, or turn cold or blue.   Do not put pressure on any part of your cast or splint; it may break. Rest your cast only on a pillow the first 24 hours until it is fully hardened.   Your cast or splint can be protected during bathing with a plastic bag sealed to your skin with medical tape. Do not lower the cast or splint into water.   Take medicines as directed by your health care provider. Only take over-the-counter or prescription medicines for pain, discomfort, or fever as directed by your health care provider.   Do not drive a vehicle until  your health care provider specifically tells you it is safe to do so.   If your health care provider has given you a follow-up appointment, it is very important to keep that appointment. Not keeping the appointment could result in a chronic or permanent injury, pain, and disability. If you have any problem keeping the appointment, call the facility for assistance.  SEEK MEDICAL CARE IF:  You develop increased swelling or discomfort.  SEEK IMMEDIATE MEDICAL CARE IF:    Your cast gets damaged or breaks.   You have continued severe pain.   You develop new pain or swelling after the cast was put on.   Your skin or toenails below the injury turn blue or gray.   Your skin or toenails below the injury feel cold, numb, or have loss of sensitivity to touch.   There is a bad smell or pus draining from under the cast.  MAKE SURE YOU:    Understand these instructions.   Will watch your condition.   Will get help right away if you are not doing well or get worse.  Document Released: 07/09/2000 Document Revised: 07/17/2013 Document Reviewed: 02/08/2013  [employer] Patient Information 2015 [employer]. This information is not intended to replace advice given to you by your health care provider. Make sure you discuss any questions you have with your health care provider.

## 2015-04-30 ENCOUNTER — Encounter: Payer: Self-pay | Admitting: Family Medicine

## 2015-04-30 ENCOUNTER — Ambulatory Visit (INDEPENDENT_AMBULATORY_CARE_PROVIDER_SITE_OTHER): Payer: BLUE CROSS/BLUE SHIELD | Admitting: Family Medicine

## 2015-04-30 VITALS — BP 116/82 | HR 83 | Ht 66.0 in | Wt 250.0 lb

## 2015-04-30 DIAGNOSIS — S99911A Unspecified injury of right ankle, initial encounter: Secondary | ICD-10-CM

## 2015-04-30 MED ORDER — OXYCODONE-ACETAMINOPHEN 7.5-325 MG PO TABS: 1.0000 | ORAL_TABLET | Freq: Four times a day (QID) | ORAL | Status: DC | PRN

## 2015-04-30 NOTE — Patient Instructions (Signed)
You have a small avulsion fracture of your fibula (ankle). Wear cam walker when up and walking around. It's ok to put weight on this leg when you can. Crutches as needed - transition out of these as tolerated. ACE wrap for compression. Elevation, icing for swelling. Out of work - likely for 6 total weeks. Follow up with me in 2 weeks. Percocet as needed for severe pain.

## 2015-05-01 DIAGNOSIS — S99911A Unspecified injury of right ankle, initial encounter: Secondary | ICD-10-CM | POA: Insufficient documentation

## 2015-05-01 NOTE — Assessment & Plan Note (Signed)
consistent with small distal fibula avulsion fracture on radiographs.  Cam walker, weight bearing as tolerated.  Crutches as needed.  ACE wrap, elevation, icing.  Percocet as needed.  F/u in 2 weeks.  Expect out of work for total of 6 weeks.

## 2015-05-01 NOTE — Progress Notes (Signed)
PCP: MILLER, DAVID Tiajuana Amass., MD  Subjective:   HPI: Patient is a 44 y.o. female here for right ankle injury.  Patient reports on 10/1 she accidentally tripped in her house and struck lateral right ankle on her bed. Severe lateral ankle pain, currently still at 8/10 level. Associated swelling, throbbing pain. Still with difficulty bearing weight. No skin changes, fever. No prior injuries. Taking pain medication  Past Medical History  Diagnosis Date  . Chronic back pain   . Hypertension   . High cholesterol   . Thyroid disease   . Coronary artery disease   . Morbid obesity (HCC)     Current Outpatient Prescriptions on File Prior to Visit  Medication Sig Dispense Refill  . metoprolol tartrate (LOPRESSOR) 25 MG tablet Take 25 mg by mouth 2 (two) times daily.      . ondansetron (ZOFRAN ODT) 4 MG disintegrating tablet Take 1 tablet (4 mg total) by mouth every 8 (eight) hours as needed for nausea or vomiting. 20 tablet 0  . Pheniramine-PE-APAP (THERAFLU COLD & SORE THROAT PO) Take 1 packet by mouth once as needed.       No current facility-administered medications on file prior to visit.    Past Surgical History  Procedure Laterality Date  . Cesarean section    . Thyroid surgery    . Tonsillectomy    . Thyroid surgery    . Cesarean section    . Coronary angioplasty with stent placement    . Joint replacement    . Total hip arthroplasty    . Tubal ligation    . Gastric bypass      No Known Allergies  Social History   Social History  . Marital Status: Single    Spouse Name: N/A  . Number of Children: N/A  . Years of Education: N/A   Occupational History  . Not on file.   Social History Main Topics  . Smoking status: Never Smoker   . Smokeless tobacco: Not on file  . Alcohol Use: No  . Drug Use: No  . Sexual Activity: No   Other Topics Concern  . Not on file   Social History Narrative    No family history on file.  BP 116/82 mmHg  Pulse 83  Ht 5'  6" (1.676 m)  Wt 250 lb (113.399 kg)  BMI 40.37 kg/m2  Review of Systems: See HPI above.    Objective:  Physical Exam:  Gen: NAD  Right ankle: Mod diffuse swelling but greatest laterally.  No bruising.  No other deformity. Very limited motion all directions but able to do so. TTP distal lateral malleolus > ATFL.  No other foot/ankle tenderness. Negative ant drawer and talar tilt.   Thompsons test negative. NV intact distally.  Left ankle: Mild diffuse swelling.  FROM without pain.    Assessment & Plan:  1. Right ankle injury - consistent with small distal fibula avulsion fracture on radiographs.  Cam walker, weight bearing as tolerated.  Crutches as needed.  ACE wrap, elevation, icing.  Percocet as needed.  F/u in 2 weeks.  Expect out of work for total of 6 weeks.

## 2015-05-12 ENCOUNTER — Ambulatory Visit (INDEPENDENT_AMBULATORY_CARE_PROVIDER_SITE_OTHER): Payer: BLUE CROSS/BLUE SHIELD | Admitting: Family Medicine

## 2015-05-12 ENCOUNTER — Encounter: Payer: BLUE CROSS/BLUE SHIELD | Admitting: Family Medicine

## 2015-05-12 ENCOUNTER — Ambulatory Visit (HOSPITAL_BASED_OUTPATIENT_CLINIC_OR_DEPARTMENT_OTHER)
Admission: RE | Admit: 2015-05-12 | Discharge: 2015-05-12 | Disposition: A | Discharge status: Home / Self Care | Payer: BLUE CROSS/BLUE SHIELD | Source: Ambulatory Visit | Attending: Family Medicine | Admitting: Family Medicine

## 2015-05-12 ENCOUNTER — Encounter: Payer: Self-pay | Admitting: Family Medicine

## 2015-05-12 ENCOUNTER — Encounter (INDEPENDENT_AMBULATORY_CARE_PROVIDER_SITE_OTHER): Payer: Self-pay

## 2015-05-12 ENCOUNTER — Telehealth: Payer: Self-pay | Admitting: Family Medicine

## 2015-05-12 VITALS — BP 135/84 | Ht 66.0 in | Wt 250.0 lb

## 2015-05-12 DIAGNOSIS — M79604 Pain in right leg: Secondary | ICD-10-CM | POA: Insufficient documentation

## 2015-05-12 DIAGNOSIS — M7989 Other specified soft tissue disorders: Secondary | ICD-10-CM | POA: Diagnosis not present

## 2015-05-12 DIAGNOSIS — S99911A Unspecified injury of right ankle, initial encounter: Secondary | ICD-10-CM

## 2015-05-12 NOTE — Telephone Encounter (Signed)
I think we should to make sure she doesn't have an infection on top of the fracture here.

## 2015-05-12 NOTE — Patient Instructions (Signed)
Get the doppler ultrasound of your leg to make sure you don't have a blood clot. I'm concerned you may be developing CRPS (complex regional pain syndrome) Take vitamin C 500mg  daily. Continue wearing the boot - come out of this a couple times a day to do simple motion exercises. Icing, ACE wrap, elevation as you have been. Take the percocet as needed as well. Follow up with me in 2 weeks assuming everything is ok on the ultrasound.

## 2015-05-13 NOTE — Progress Notes (Signed)
This encounter was created in error - please disregard.

## 2015-05-14 ENCOUNTER — Ambulatory Visit: Payer: BLUE CROSS/BLUE SHIELD | Admitting: Family Medicine

## 2015-05-14 NOTE — Assessment & Plan Note (Signed)
consistent with small distal fibula avulsion fracture on radiographs.  Worsening pain to 10/10 with calf pain - doppler u/s done today negative for DVT.  Discussed developing CRPS is a possibility - encouraged to start taking vitamin C every day.  Otherwise continue cam walker, weight bearing as tolerated, ACE wrap, elevation, icing, percocet.  Continue out of work.  F/u in 2 weeks.

## 2015-05-14 NOTE — Progress Notes (Signed)
PCP: MILLER, DAVID [name], MD  Subjective:   HPI: Patient is a 44 y.o. female here for right ankle injury.  10/5: Patient reports on 10/1 she accidentally tripped in her house and struck lateral right ankle on her bed. Severe lateral ankle pain, currently still at 8/10 level. Associated swelling, throbbing pain. Still with difficulty bearing weight. No skin changes, fever. No prior injuries. Taking pain medication  10/17: Patient returns today reporting 10/10 level pain right ankle. Pain mainly lateral still. Increased swelling, throbbing and aching. Using the boot at all times. Is elevating, icing. No new injuries. No fevers, redness, other skin changes.  Past Medical History  Diagnosis Date  . Chronic back pain   . Hypertension   . High cholesterol   . Thyroid disease   . Coronary artery disease   . Morbid obesity (HCC)     Current Outpatient Prescriptions on File Prior to Visit  Medication Sig Dispense Refill  . lisinopril (PRINIVIL,ZESTRIL) 20 MG tablet   1  . lovastatin (MEVACOR) 20 MG tablet   4  . metoprolol tartrate (LOPRESSOR) 25 MG tablet Take 25 mg by mouth 2 (two) times daily.      Marland Kitchen. omeprazole (PRILOSEC) 20 MG capsule   0  . ondansetron (ZOFRAN ODT) 4 MG disintegrating tablet Take 1 tablet (4 mg total) by mouth every 8 (eight) hours as needed for nausea or vomiting. 20 tablet 0  . oxyCODONE-acetaminophen (PERCOCET) 7.5-325 MG tablet Take 1 tablet by mouth every 6 (six) hours as needed for severe pain. 60 tablet 0  . Pheniramine-PE-APAP (THERAFLU COLD & SORE THROAT PO) Take 1 packet by mouth once as needed.      . ranitidine (ZANTAC) 150 MG tablet   0   No current facility-administered medications on file prior to visit.    Past Surgical History  Procedure Laterality Date  . Cesarean section    . Thyroid surgery    . Tonsillectomy    . Thyroid surgery    . Cesarean section    . Coronary angioplasty with stent placement    . Joint replacement     . Total hip arthroplasty    . Tubal ligation    . Gastric bypass      No Known Allergies  Social History   Social History  . Marital Status: Single    Spouse Name: N/A  . Number of Children: N/A  . Years of Education: N/A   Occupational History  . Not on file.   Social History Main Topics  . Smoking status: Never Smoker   . Smokeless tobacco: Not on file  . Alcohol Use: No  . Drug Use: No  . Sexual Activity: No   Other Topics Concern  . Not on file   Social History Narrative    No family history on file.  BP 135/84 mmHg  Ht 5\' 6"  (1.676 m)  Wt 250 lb (113.399 kg)  BMI 40.37 kg/m2  Review of Systems: See HPI above.    Objective:  Physical Exam:  Gen: NAD  Right ankle: Mod diffuse swelling but greatest laterally.  No bruising.  No other deformity.  No redness.  Mild warmth however. Very limited motion all directions but able to do so. TTP distal lateral malleolus > ATFL.  Tenderness calf as well.  No other foot/ankle tenderness. Negative ant drawer and talar tilt.   Thompsons test negative. NV intact distally.  Left ankle: Mild diffuse swelling.  FROM without pain.  Assessment & Plan:  1. Right ankle injury - consistent with small distal fibula avulsion fracture on radiographs.  Worsening pain to 10/10 with calf pain - doppler u/s done today negative for DVT.  Discussed developing CRPS is a possibility - encouraged to start taking vitamin C every day.  Otherwise continue cam walker, weight bearing as tolerated, ACE wrap, elevation, icing, percocet.  Continue out of work.  F/u in 2 weeks.

## 2015-05-26 ENCOUNTER — Encounter: Payer: Self-pay | Admitting: Family Medicine

## 2015-05-26 ENCOUNTER — Ambulatory Visit (INDEPENDENT_AMBULATORY_CARE_PROVIDER_SITE_OTHER): Payer: BLUE CROSS/BLUE SHIELD | Admitting: Family Medicine

## 2015-05-26 VITALS — BP 132/89 | HR 87 | Ht 66.0 in | Wt 240.0 lb

## 2015-05-26 DIAGNOSIS — M10072 Idiopathic gout, left ankle and foot: Secondary | ICD-10-CM | POA: Diagnosis not present

## 2015-05-26 DIAGNOSIS — S99911D Unspecified injury of right ankle, subsequent encounter: Secondary | ICD-10-CM | POA: Diagnosis not present

## 2015-05-26 MED ORDER — COLCHICINE 0.6 MG PO TABS: 0.6000 mg | ORAL_TABLET | Freq: Two times a day (BID) | ORAL | Status: AC

## 2015-05-26 NOTE — Patient Instructions (Addendum)
I would switch out of the cam walker now. If the achilles pain gets worse you can try heel lifts, calf raise exercises - I'm hoping just coming out of the boot will make this resolve though. Take colchicine twice a day until pain has resolved (or almost resolved) then back down to once a day for a few days, then stop. Follow up with me in 1 month for reevaluation.

## 2015-05-27 DIAGNOSIS — M79672 Pain in left foot: Secondary | ICD-10-CM | POA: Insufficient documentation

## 2015-05-27 NOTE — Assessment & Plan Note (Signed)
consistent with small distal fibula avulsion fracture on radiographs.  Clinically improving - switch out of cam walker to regular shoe now.  Elevating, icing, ace wrap if needed for swelling.  F/u in 1 month.

## 2015-05-27 NOTE — Assessment & Plan Note (Signed)
discussed options - has had gastric bypass so will avoid nsaids.  Colchicine twice a day then back down to once a day.  Consider prednisone dose pack or injection if she is struggling.

## 2015-05-27 NOTE — Progress Notes (Signed)
PCP: MILLER, DAVID Tiajuana Amass., MD  Subjective:   HPI: Patient is a 44 y.o. female here for right ankle injury.  10/5: Patient reports on 10/1 she accidentally tripped in her house and struck lateral right ankle on her bed. Severe lateral ankle pain, currently still at 8/10 level. Associated swelling, throbbing pain. Still with difficulty bearing weight. No skin changes, fever. No prior injuries. Taking pain medication  10/17: Patient returns today reporting 10/10 level pain right ankle. Pain mainly lateral still. Increased swelling, throbbing and aching. Using the boot at all times. Is elevating, icing. No new injuries. No fevers, redness, other skin changes.  10/29: Patient reports pain is much better. Down to 2/10 pain mainly in achilles now. Walking some out of the cam walker but mostly using this. Pain is a pulling, dull pain posteriorly. Also with left great toe pain at base of this toe. Has had similar problems in past though never diagnosed with gout officially. Swelling, some warmth.  No skin changes or fever. No other complaints.  Past Medical History  Diagnosis Date  . Chronic back pain   . Hypertension   . High cholesterol   . Thyroid disease   . Coronary artery disease   . Morbid obesity (HCC)     Current Outpatient Prescriptions on File Prior to Visit  Medication Sig Dispense Refill  . lisinopril (PRINIVIL,ZESTRIL) 20 MG tablet   1  . lovastatin (MEVACOR) 20 MG tablet   4  . metoprolol tartrate (LOPRESSOR) 25 MG tablet Take 25 mg by mouth 2 (two) times daily.      Marland Kitchen omeprazole (PRILOSEC) 20 MG capsule   0  . ondansetron (ZOFRAN ODT) 4 MG disintegrating tablet Take 1 tablet (4 mg total) by mouth every 8 (eight) hours as needed for nausea or vomiting. 20 tablet 0  . oxyCODONE-acetaminophen (PERCOCET) 7.5-325 MG tablet Take 1 tablet by mouth every 6 (six) hours as needed for severe pain. 60 tablet 0  . Pheniramine-PE-APAP (THERAFLU COLD & SORE THROAT PO)  Take 1 packet by mouth once as needed.      . ranitidine (ZANTAC) 150 MG tablet   0  . traMADol (ULTRAM) 50 MG tablet   0   No current facility-administered medications on file prior to visit.    Past Surgical History  Procedure Laterality Date  . Cesarean section    . Thyroid surgery    . Tonsillectomy    . Thyroid surgery    . Cesarean section    . Coronary angioplasty with stent placement    . Joint replacement    . Total hip arthroplasty    . Tubal ligation    . Gastric bypass      No Known Allergies  Social History   Social History  . Marital Status: Single    Spouse Name: N/A  . Number of Children: N/A  . Years of Education: N/A   Occupational History  . Not on file.   Social History Main Topics  . Smoking status: Never Smoker   . Smokeless tobacco: Not on file  . Alcohol Use: No  . Drug Use: No  . Sexual Activity: No   Other Topics Concern  . Not on file   Social History Narrative    No family history on file.  BP 132/89 mmHg  Pulse 87  Ht  (1.676 m)  Wt 240 lb (108.863 kg)  BMI 38.76 kg/m2  Review of Systems: See HPI above.    Objective:  Physical Exam:  Gen: NAD  Right ankle: Mild diffuse swelling. No bruising.  No other deformity.  No redness. Mild limitation motions all directions, much improved.   TTP mildly distal lateral malleolus and achilles.  No other tenderness now. Negative ant drawer and talar tilt.   Thompsons test negative. NV intact distally.  Left ankle: Mild diffuse swelling.  FROM without pain.  Left foot: Mild swelling, warmth, no bruising of 1st MTP area. TTP 1st MTP circumferentially.  No other tenderness. Pain on passive motion all directions.    Assessment & Plan:  1. Right ankle injury - consistent with small distal fibula avulsion fracture on radiographs.  Clinically improving - switch out of cam walker to regular shoe now.  Elevating, icing, ace wrap if needed for swelling.  F/u in 1 month.  2.  Acute gout flare - discussed options - has had gastric bypass so will avoid nsaids.  Colchicine twice a day then back down to once a day.  Consider prednisone dose pack or injection if she is struggling.

## 2015-06-05 ENCOUNTER — Telehealth: Payer: Self-pay | Admitting: Family Medicine

## 2015-06-05 MED ORDER — PREDNISONE 10 MG PO TABS: ORAL_TABLET | ORAL | Status: DC

## 2015-06-05 NOTE — Telephone Encounter (Signed)
Sent prednisone downstairs.  Thanks!

## 2015-06-05 NOTE — Telephone Encounter (Signed)
Spoke to patient and she would like for the prednisone to be sent to our pharmacy downstairs.

## 2015-06-05 NOTE — Telephone Encounter (Signed)
Spoke to patient and told her that her prednisone was sent to the pharmacy downstairs.

## 2015-06-05 NOTE — Telephone Encounter (Signed)
We can do prednisone - just need to know which pharmacy.

## 2015-06-25 ENCOUNTER — Encounter: Payer: Self-pay | Admitting: Family Medicine

## 2015-06-25 ENCOUNTER — Ambulatory Visit (HOSPITAL_BASED_OUTPATIENT_CLINIC_OR_DEPARTMENT_OTHER)
Admission: RE | Admit: 2015-06-25 | Discharge: 2015-06-25 | Disposition: A | Discharge status: Home / Self Care | Payer: BLUE CROSS/BLUE SHIELD | Source: Ambulatory Visit | Attending: Family Medicine | Admitting: Family Medicine

## 2015-06-25 ENCOUNTER — Ambulatory Visit (INDEPENDENT_AMBULATORY_CARE_PROVIDER_SITE_OTHER): Payer: BLUE CROSS/BLUE SHIELD | Admitting: Family Medicine

## 2015-06-25 VITALS — BP 132/91 | HR 85 | Ht 66.0 in | Wt 240.0 lb

## 2015-06-25 DIAGNOSIS — M79672 Pain in left foot: Secondary | ICD-10-CM

## 2015-06-25 NOTE — Patient Instructions (Signed)
A stress fracture is possible but typically will heal on its own. Wear the cam boot you have for 3-4 weeks when up and walking around. Elevate, ice 15 minutes at a time 3-4 times a day. Call me if you're still not improving and the next step would be to do an MRI of this foot.

## 2015-06-25 NOTE — Assessment & Plan Note (Signed)
not improving with colchicine or prednisone as would expect with an acute gout flare.  No injury to suggest stress fracture.  Independently reviewed radiographs and no evidence arthritis 1st MTP.  Discussed options of cam boot vs MRI.  Will start with cam boot for 3-4 weeks, icing, elevation.  F/u in 3-4 weeks.

## 2015-06-25 NOTE — Progress Notes (Signed)
PCP: MILLER, DAVID Tiajuana Amass., MD  Subjective:   HPI: Patient is a 44 y.o. female here for right ankle injury.  10/5: Patient reports on 10/1 she accidentally tripped in her house and struck lateral right ankle on her bed. Severe lateral ankle pain, currently still at 8/10 level. Associated swelling, throbbing pain. Still with difficulty bearing weight. No skin changes, fever. No prior injuries. Taking pain medication  10/17: Patient returns today reporting 10/10 level pain right ankle. Pain mainly lateral still. Increased swelling, throbbing and aching. Using the boot at all times. Is elevating, icing. No new injuries. No fevers, redness, other skin changes.  10/29: Patient reports pain is much better. Down to 2/10 pain mainly in achilles now. Walking some out of the cam walker but mostly using this. Pain is a pulling, dull pain posteriorly. Also with left great toe pain at base of this toe. Has had similar problems in past though never diagnosed with gout officially. Swelling, some warmth.  No skin changes or fever. No other complaints.  11/30: Patient reports her left foot continues to bother her. Pain level 7/10 medial dorsal foot near base of great toe. Sharp, difficulty walking. Prednisone eased this off but returned. Colchicine without benefit. Also bothers worse with standing. No skin changes, fever. + swelling. Past Medical History  Diagnosis Date  . Chronic back pain   . Hypertension   . High cholesterol   . Thyroid disease   . Coronary artery disease   . Morbid obesity (HCC)     Current Outpatient Prescriptions on File Prior to Visit  Medication Sig Dispense Refill  . colchicine 0.6 MG tablet Take 1 tablet (0.6 mg total) by mouth 2 (two) times daily. 60 tablet 0  . lisinopril (PRINIVIL,ZESTRIL) 20 MG tablet   1  . lovastatin (MEVACOR) 20 MG tablet   4  . metoprolol tartrate (LOPRESSOR) 25 MG tablet Take 25 mg by mouth 2 (two) times daily.      Marland Kitchen  omeprazole (PRILOSEC) 20 MG capsule   0  . ondansetron (ZOFRAN ODT) 4 MG disintegrating tablet Take 1 tablet (4 mg total) by mouth every 8 (eight) hours as needed for nausea or vomiting. 20 tablet 0  . oxyCODONE-acetaminophen (PERCOCET) 7.5-325 MG tablet Take 1 tablet by mouth every 6 (six) hours as needed for severe pain. 60 tablet 0  . Pheniramine-PE-APAP (THERAFLU COLD & SORE THROAT PO) Take 1 packet by mouth once as needed.      . predniSONE (DELTASONE) 10 MG tablet 6 tabs po day 1, 5 tabs po day 2, 4 tabs po day 3, 3 tabs po day 4, 2 tabs po day 5, 1 tab po day 6 21 tablet 0  . ranitidine (ZANTAC) 150 MG tablet   0  . traMADol (ULTRAM) 50 MG tablet   0   No current facility-administered medications on file prior to visit.    Past Surgical History  Procedure Laterality Date  . Cesarean section    . Thyroid surgery    . Tonsillectomy    . Thyroid surgery    . Cesarean section    . Coronary angioplasty with stent placement    . Joint replacement    . Total hip arthroplasty    . Tubal ligation    . Gastric bypass      No Known Allergies  Social History   Social History  . Marital Status: Single    Spouse Name: N/A  . Number of Children: N/A  .  Years of Education: N/A   Occupational History  . Not on file.   Social History Main Topics  . Smoking status: Never Smoker   . Smokeless tobacco: Not on file  . Alcohol Use: No  . Drug Use: No  . Sexual Activity: No   Other Topics Concern  . Not on file   Social History Narrative    No family history on file.  BP 132/91 mmHg  Pulse 85  Ht 5\' 6"  (1.676 m)  Wt 240 lb (108.863 kg)  BMI 38.76 kg/m2  Review of Systems: See HPI above.    Objective:  Physical Exam:  Gen: NAD  Right ankle/foot: Mild diffuse swelling. No bruising.  No other deformity.  No redness. FROM without pain.  Left ankle: Mild diffuse swelling.  FROM without pain.  Left foot/ankle: Mild swelling dorsal foot but no warmth or erythema,  bruising. TTP 1st MTP circumferentially, medial left foot over 1st, 2nd metatarsals.  No other tenderness. FROM ankle without pain. Pain on passive motion all directions of 1st MTP. Strength 5/5 all motions of ankle.    Assessment & Plan:  1. Left foot pain - not improving with colchicine or prednisone as would expect with an acute gout flare.  No injury to suggest stress fracture.  Independently reviewed radiographs and no evidence arthritis 1st MTP.  Discussed options of cam boot vs MRI.  Will start with cam boot for 3-4 weeks, icing, elevation.  F/u in 3-4 weeks.

## 2015-10-12 ENCOUNTER — Emergency Department (HOSPITAL_COMMUNITY)
Admission: EM | Admit: 2015-10-12 | Discharge: 2015-10-13 | Disposition: A | Discharge status: Home / Self Care | Payer: BLUE CROSS/BLUE SHIELD | Attending: Emergency Medicine | Admitting: Emergency Medicine

## 2015-10-12 ENCOUNTER — Encounter (HOSPITAL_COMMUNITY): Payer: Self-pay | Admitting: Adult Health

## 2015-10-12 ENCOUNTER — Emergency Department (HOSPITAL_COMMUNITY): Payer: BLUE CROSS/BLUE SHIELD

## 2015-10-12 DIAGNOSIS — R112 Nausea with vomiting, unspecified: Secondary | ICD-10-CM | POA: Insufficient documentation

## 2015-10-12 DIAGNOSIS — R197 Diarrhea, unspecified: Secondary | ICD-10-CM | POA: Insufficient documentation

## 2015-10-12 DIAGNOSIS — I2510 Atherosclerotic heart disease of native coronary artery without angina pectoris: Secondary | ICD-10-CM | POA: Diagnosis not present

## 2015-10-12 DIAGNOSIS — Z9861 Coronary angioplasty status: Secondary | ICD-10-CM | POA: Insufficient documentation

## 2015-10-12 DIAGNOSIS — R1012 Left upper quadrant pain: Secondary | ICD-10-CM | POA: Diagnosis not present

## 2015-10-12 DIAGNOSIS — G8929 Other chronic pain: Secondary | ICD-10-CM | POA: Insufficient documentation

## 2015-10-12 DIAGNOSIS — R630 Anorexia: Secondary | ICD-10-CM | POA: Diagnosis not present

## 2015-10-12 DIAGNOSIS — I10 Essential (primary) hypertension: Secondary | ICD-10-CM | POA: Insufficient documentation

## 2015-10-12 DIAGNOSIS — Z79899 Other long term (current) drug therapy: Secondary | ICD-10-CM | POA: Insufficient documentation

## 2015-10-12 DIAGNOSIS — Z87442 Personal history of urinary calculi: Secondary | ICD-10-CM | POA: Diagnosis not present

## 2015-10-12 DIAGNOSIS — Z7952 Long term (current) use of systemic steroids: Secondary | ICD-10-CM | POA: Diagnosis not present

## 2015-10-12 DIAGNOSIS — R109 Unspecified abdominal pain: Secondary | ICD-10-CM | POA: Diagnosis present

## 2015-10-12 DIAGNOSIS — E7800 Pure hypercholesterolemia, unspecified: Secondary | ICD-10-CM | POA: Diagnosis not present

## 2015-10-12 DIAGNOSIS — R319 Hematuria, unspecified: Secondary | ICD-10-CM

## 2015-10-12 LAB — URINE MICROSCOPIC-ADD ON

## 2015-10-12 LAB — URINALYSIS, ROUTINE W REFLEX MICROSCOPIC
Glucose, UA: NEGATIVE mg/dL
Leukocytes, UA: NEGATIVE
NITRITE: NEGATIVE
PH: 5.5 (ref 5.0–8.0)
Protein, ur: 100 mg/dL — AB
Specific Gravity, Urine: 1.026 (ref 1.005–1.030)

## 2015-10-12 LAB — BASIC METABOLIC PANEL
Anion gap: 13 (ref 5–15)
BUN: 9 mg/dL (ref 6–20)
CALCIUM: 9.7 mg/dL (ref 8.9–10.3)
CO2: 26 mmol/L (ref 22–32)
CREATININE: 1.67 mg/dL — AB (ref 0.44–1.00)
Chloride: 105 mmol/L (ref 101–111)
GFR calc non Af Amer: 36 mL/min — ABNORMAL LOW (ref >60)
GFR, EST AFRICAN AMERICAN: 41 mL/min — AB (ref >60)
Glucose, Bld: 83 mg/dL (ref 65–99)
Potassium: 3.3 mmol/L — ABNORMAL LOW (ref 3.5–5.1)
Sodium: 144 mmol/L (ref 135–145)

## 2015-10-12 LAB — CBC
HCT: 38.7 % (ref 36.0–46.0)
Hemoglobin: 12.8 g/dL (ref 12.0–15.0)
MCH: 28.9 pg (ref 26.0–34.0)
MCHC: 33.1 g/dL (ref 30.0–36.0)
MCV: 87.4 fL (ref 78.0–100.0)
PLATELETS: 251 10*3/uL (ref 150–400)
RBC: 4.43 MIL/uL (ref 3.87–5.11)
RDW: 14.1 % (ref 11.5–15.5)
WBC: 7 10*3/uL (ref 4.0–10.5)

## 2015-10-12 MED ORDER — SODIUM CHLORIDE 0.9 % IV BOLUS (SEPSIS)
1000.0000 mL | Freq: Once | INTRAVENOUS | Status: AC
  Administered 2015-10-12: 1000 mL via INTRAVENOUS

## 2015-10-12 MED ORDER — MORPHINE SULFATE (PF) 4 MG/ML IV SOLN
4.0000 mg | Freq: Once | INTRAVENOUS | Status: AC
  Administered 2015-10-12: 4 mg via INTRAVENOUS
  Filled 2015-10-12: qty 1

## 2015-10-12 MED ORDER — ONDANSETRON 4 MG PO TBDP
ORAL_TABLET | ORAL | Status: DC
Start: 2015-10-12 — End: 2015-10-13
  Filled 2015-10-12: qty 1

## 2015-10-12 MED ORDER — HYDROMORPHONE HCL 1 MG/ML IJ SOLN
1.0000 mg | Freq: Once | INTRAMUSCULAR | Status: AC
  Administered 2015-10-12: 1 mg via INTRAVENOUS
  Filled 2015-10-12: qty 1

## 2015-10-12 MED ORDER — ONDANSETRON 4 MG PO TBDP
4.0000 mg | ORAL_TABLET | Freq: Once | ORAL | Status: AC
  Administered 2015-10-12: 4 mg via ORAL

## 2015-10-12 NOTE — ED Notes (Signed)
Pt requesting more pain meds, MD made aware. 

## 2015-10-12 NOTE — ED Notes (Signed)
Presents with nausea, vomting and 2 days of diarrhea began 3 days ago, denies fevers associated with left sided flank pain-pt states she has thrown up a a lot in the past 3 days. Hx of gastric bypass in August of last year. uanble to hold fluids down. Denies dysuria or hematuria, denies urinary frequency and urgency.

## 2015-10-12 NOTE — Discharge Instructions (Signed)
If you were given medicines take as directed.  If you are on coumadin or contraceptives realize their levels and effectiveness is altered by many different medicines.  If you have any reaction (rash, tongues swelling, other) to the medicines stop taking and see a physician.    If your blood pressure was elevated in the ER make sure you follow up for management with a primary doctor or return for chest pain, shortness of breath or stroke symptoms.  Please follow up as directed and return to the ER or see a physician for new or worsening symptoms.  Thank you. Filed Vitals:   10/12/15 1854  BP: 159/110  Pulse: 90  Temp: 98.9 F (37.2 C)  TempSrc: Oral  Resp: 18  Weight: 191 lb (86.637 kg)  SpO2: 99%

## 2015-10-12 NOTE — ED Provider Notes (Signed)
CSN: [id number]     Arrival date & time 10/12/15  1845 History   First MD Initiated Contact with Patient 10/12/15 2047     Chief Complaint  Patient presents with  . Flank Pain     (Consider location/radiation/quality/duration/timing/severity/associated sxs/prior Treatment) HPI Comments: 45 year old female with history of obesity, high blood pressure, gastric bypass surgery in August, kidney stone history presents with left flank pain and recurrent vomiting with mild diarrhea. Nonbloody. Worsen for approximate 3 days. No gross hematuria. No vaginal bleeding. Patient unable to keep significant fluids down. Nothing is improved her pain. Sharp.  Patient is a 45 y.o. female presenting with flank pain. The history is provided by the patient.  Flank Pain Pertinent negatives include no chest pain, no abdominal pain, no headaches and no shortness of breath.    Past Medical History  Diagnosis Date  . Chronic back pain   . Hypertension   . High cholesterol   . Thyroid disease   . Coronary artery disease   . Morbid obesity [facility](HCC)    Past Surgical History  Procedure Laterality Date  . Cesarean section    . Thyroid surgery    . Tonsillectomy    . Thyroid surgery    . Cesarean section    . Coronary angioplasty with stent placement    . Joint replacement    . Total hip arthroplasty    . Tubal ligation    . Gastric bypass     History reviewed. No pertinent family history. Social History  Substance Use Topics  . Smoking status: Never Smoker   . Smokeless tobacco: None  . Alcohol Use: No   OB History    No data available     Review of Systems  Constitutional: Positive for appetite change. Negative for fever and chills.  HENT: Negative for congestion.   Eyes: Negative for visual disturbance.  Respiratory: Negative for shortness of breath.   Cardiovascular: Negative for chest pain.  Gastrointestinal: Positive for nausea, vomiting and diarrhea. Negative for abdominal pain.   Genitourinary: Positive for flank pain. Negative for dysuria.  Musculoskeletal: Negative for back pain, neck pain and neck stiffness.  Skin: Negative for rash.  Neurological: Negative for light-headedness and headaches.      Allergies  Review of patient's allergies indicates no known allergies.  Home Medications   Prior to Admission medications   Medication Sig Start Date End Date Taking? Authorizing Provider  colchicine 0.6 MG tablet Take 1 tablet (0.6 mg total) by mouth 2 (two) times daily. 05/26/15   Lenda KelpShane R Hudnall, MD  lisinopril (PRINIVIL,ZESTRIL) 20 MG tablet  03/05/15   Historical Provider, MD  lovastatin (MEVACOR) 20 MG tablet  03/06/15   Historical Provider, MD  metoprolol tartrate (LOPRESSOR) 25 MG tablet Take 25 mg by mouth 2 (two) times daily.      Historical Provider, MD  omeprazole (PRILOSEC) 20 MG capsule  03/20/15   Historical Provider, MD  ondansetron (ZOFRAN ODT) 4 MG disintegrating tablet Take 1 tablet (4 mg total) by mouth every 8 (eight) hours as needed for nausea or vomiting. 10/03/13   Teressa LowerVrinda Pickering, NP  oxyCODONE-acetaminophen (PERCOCET) 7.5-325 MG tablet Take 1 tablet by mouth every 6 (six) hours as needed for severe pain. 04/30/15   Lenda KelpShane R Hudnall, MD  Pheniramine-PE-APAP (THERAFLU COLD & SORE THROAT PO) Take 1 packet by mouth once as needed.      Historical Provider, MD  predniSONE (DELTASONE) 10 MG tablet 6 tabs po day 1, 5 tabs po  day 2, 4 tabs po day 3, 3 tabs po day 4, 2 tabs po day 5, 1 tab po day 6 06/05/15   Lenda Kelp, MD  ranitidine (ZANTAC) 150 MG tablet  02/27/15   Historical Provider, MD  traMADol (ULTRAM) 50 MG tablet  03/24/15   Historical Provider, MD   BP 159/110 mmHg  Pulse 90  Temp(Src) 98.9 F (37.2 C) (Oral)  Resp 18  Wt 191 lb (86.637 kg)  SpO2 99% Physical Exam  Constitutional: She is oriented to person, place, and time. She appears well-developed and well-nourished.  HENT:  Head: Normocephalic and atraumatic.  Dry mucous  membranes  Eyes: Conjunctivae are normal. Right eye exhibits no discharge. Left eye exhibits no discharge.  Neck: Normal range of motion. Neck supple. No tracheal deviation present.  Cardiovascular: Normal rate and regular rhythm.   Pulmonary/Chest: Effort normal and breath sounds normal.  Abdominal: Soft. She exhibits no distension. There is no tenderness. There is no guarding.  Musculoskeletal: She exhibits tenderness (mild left upper quadrant/left flank). She exhibits no edema.  Neurological: She is alert and oriented to person, place, and time.  Skin: Skin is warm. No rash noted.  Psychiatric: She has a normal mood and affect.  Nursing note and vitals reviewed.   ED Course  Procedures (including critical care time) Labs Review Labs Reviewed  URINALYSIS, ROUTINE W REFLEX MICROSCOPIC (NOT AT [facility]) - Abnormal; Notable for the following:    Hgb urine dipstick MODERATE (*)    Bilirubin Urine MODERATE (*)    Ketones, ur >80 (*)    Protein, ur 100 (*)    All other components within normal limits  BASIC METABOLIC PANEL - Abnormal; Notable for the following:    Potassium 3.3 (*)    Creatinine, Ser 1.67 (*)    GFR calc non Af Amer 36 (*)    GFR calc Af Amer 41 (*)    All other components within normal limits  URINE MICROSCOPIC-ADD ON - Abnormal; Notable for the following:    Squamous Epithelial / LPF 0-5 (*)    Bacteria, UA RARE (*)    All other components within normal limits  CBC    Imaging Review No results found. I have personally reviewed and evaluated these images and lab results as part of my medical decision-making.   EKG Interpretation None      MDM   Final diagnoses:  Acute left flank pain  Non-intractable vomiting with nausea, vomiting of unspecified type  Hematuria   Patient with gastric bypass history presents with worsening left flank pain and vomiting. Discussed clinical concern for kidney stone versus viral/toxin mediated versus related to gastric bypass.  Plan for CT scan, blood work, IV fluids pain meds. Patient requiring increased pain meds in the ER. CT scan results reviewed 16 mm kidney stone on the left with moderate to severe hydronephrosis. Paged urology can follow closely in the clinic, pain controlled.   The patients results and plan were reviewed and discussed.   Any x-rays performed were independently reviewed by myself.   Differential diagnosis were considered with the presenting HPI.  Medications  ondansetron (ZOFRAN-ODT) 4 MG disintegrating tablet (not administered)  ondansetron (ZOFRAN-ODT) disintegrating tablet 4 mg (4 mg Oral Given 10/12/15 1859)  sodium chloride 0.9 % bolus 1,000 mL (0 mLs Intravenous Stopped 10/12/15 2342)  morphine 4 MG/ML injection 4 mg (4 mg Intravenous Given 10/12/15 2125)  HYDROmorphone (DILAUDID) injection 1 mg (1 mg Intravenous Given 10/12/15 2345)  Filed Vitals:   10/12/15 1854 10/12/15 2254  BP: 159/110 144/88  Pulse: 90 85  Temp: 98.9 F (37.2 C)   TempSrc: Oral   Resp: 18 17  Weight: 191 lb (86.637 kg)   SpO2: 99% 99%    Final diagnoses:  Acute left flank pain  Non-intractable vomiting with nausea, vomiting of unspecified type  Hematuria      Blane Ohara, MD 10/13/15 [phone]

## 2015-10-13 ENCOUNTER — Other Ambulatory Visit: Payer: Self-pay | Admitting: Urology

## 2015-10-13 ENCOUNTER — Encounter (HOSPITAL_COMMUNITY): Payer: Self-pay | Admitting: *Deleted

## 2015-10-13 MED ORDER — ONDANSETRON 4 MG PO TBDP: ORAL_TABLET | ORAL | Status: DC

## 2015-10-13 MED ORDER — HYDROCODONE-ACETAMINOPHEN 5-325 MG PO TABS: 2.0000 | ORAL_TABLET | ORAL | Status: DC | PRN

## 2015-10-13 NOTE — Progress Notes (Signed)
Spoke to patient via phone,history obtained,updated.  Bring blue folder,insurance cards,picture ID,designated driver Reinforced no aspirin(instructions to hold aspirin per your doctor), ibuprofen products 72 hours prior to procedure. No vitamins or herbal medicines 7 days prior to procedure.   Follow laxative instructions provided by urologist (office) and in blue folder.  Wear easy on/off clothing and no jewelry except wedding rings and ear rings. Leave all other valuables at home.    .call md office if you have a cold,sorethroat or fever.  NPO past MN Verbalizes understanding of instructions

## 2015-10-13 NOTE — ED Notes (Signed)
Patient left at this time with all belongings. 

## 2015-10-14 ENCOUNTER — Ambulatory Visit (HOSPITAL_COMMUNITY)
Admission: RE | Admit: 2015-10-14 | Discharge: 2015-10-14 | Disposition: A | Discharge status: Home / Self Care | Payer: BLUE CROSS/BLUE SHIELD | Source: Ambulatory Visit | Attending: Urology | Admitting: Urology

## 2015-10-14 ENCOUNTER — Other Ambulatory Visit: Payer: Self-pay

## 2015-10-14 DIAGNOSIS — Z01818 Encounter for other preprocedural examination: Secondary | ICD-10-CM | POA: Insufficient documentation

## 2015-10-16 ENCOUNTER — Encounter (HOSPITAL_COMMUNITY)
Admission: RE | Disposition: A | Discharge status: Home / Self Care | Payer: Self-pay | Source: Ambulatory Visit | Attending: Urology

## 2015-10-16 ENCOUNTER — Encounter (HOSPITAL_COMMUNITY): Payer: Self-pay | Admitting: General Practice

## 2015-10-16 ENCOUNTER — Ambulatory Visit (HOSPITAL_COMMUNITY): Payer: BLUE CROSS/BLUE SHIELD

## 2015-10-16 ENCOUNTER — Ambulatory Visit (HOSPITAL_COMMUNITY)
Admission: RE | Admit: 2015-10-16 | Discharge: 2015-10-16 | Disposition: A | Discharge status: Home / Self Care | Payer: BLUE CROSS/BLUE SHIELD | Source: Ambulatory Visit | Attending: Urology | Admitting: Urology

## 2015-10-16 DIAGNOSIS — N201 Calculus of ureter: Secondary | ICD-10-CM | POA: Diagnosis not present

## 2015-10-16 DIAGNOSIS — Z955 Presence of coronary angioplasty implant and graft: Secondary | ICD-10-CM | POA: Insufficient documentation

## 2015-10-16 DIAGNOSIS — Z9884 Bariatric surgery status: Secondary | ICD-10-CM | POA: Diagnosis not present

## 2015-10-16 DIAGNOSIS — I10 Essential (primary) hypertension: Secondary | ICD-10-CM | POA: Diagnosis not present

## 2015-10-16 DIAGNOSIS — Z6831 Body mass index (BMI) 31.0-31.9, adult: Secondary | ICD-10-CM | POA: Insufficient documentation

## 2015-10-16 DIAGNOSIS — I2510 Atherosclerotic heart disease of native coronary artery without angina pectoris: Secondary | ICD-10-CM | POA: Diagnosis not present

## 2015-10-16 DIAGNOSIS — Z79899 Other long term (current) drug therapy: Secondary | ICD-10-CM | POA: Diagnosis not present

## 2015-10-16 DIAGNOSIS — Z96649 Presence of unspecified artificial hip joint: Secondary | ICD-10-CM | POA: Insufficient documentation

## 2015-10-16 DIAGNOSIS — R109 Unspecified abdominal pain: Secondary | ICD-10-CM | POA: Diagnosis present

## 2015-10-16 DIAGNOSIS — E669 Obesity, unspecified: Secondary | ICD-10-CM | POA: Diagnosis not present

## 2015-10-16 LAB — PREGNANCY, URINE: PREG TEST UR: NEGATIVE

## 2015-10-16 SURGERY — LITHOTRIPSY, ESWL
Anesthesia: LOCAL | Laterality: Left

## 2015-10-16 MED ORDER — SODIUM CHLORIDE 0.9 % IV SOLN
INTRAVENOUS | Status: DC
  Administered 2015-10-16: 10:00:00 via INTRAVENOUS

## 2015-10-16 MED ORDER — OXYCODONE-ACETAMINOPHEN 5-325 MG PO TABS
1.0000 | ORAL_TABLET | Freq: Once | ORAL | Status: AC
  Administered 2015-10-16: 1 via ORAL
  Filled 2015-10-16: qty 1

## 2015-10-16 MED ORDER — DIPHENHYDRAMINE HCL 25 MG PO CAPS
25.0000 mg | ORAL_CAPSULE | ORAL | Status: AC
  Administered 2015-10-16: 25 mg via ORAL
  Filled 2015-10-16: qty 1

## 2015-10-16 MED ORDER — CEFAZOLIN SODIUM-DEXTROSE 2-4 GM/100ML-% IV SOLN
2.0000 g | INTRAVENOUS | Status: AC
Start: 2015-10-16 — End: 2015-10-16
  Administered 2015-10-16: 2 g via INTRAVENOUS
  Filled 2015-10-16: qty 100
  Filled 2015-10-16: qty 20

## 2015-10-16 MED ORDER — DIAZEPAM 5 MG PO TABS
10.0000 mg | ORAL_TABLET | ORAL | Status: AC
  Administered 2015-10-16: 10 mg via ORAL
  Filled 2015-10-16: qty 2

## 2015-10-16 NOTE — Discharge Instructions (Signed)
Lithotripsy, Care After °Refer to this sheet in the next few weeks. These instructions provide you with information on caring for yourself after your procedure. Your health care provider may also give you more specific instructions. Your treatment has been planned according to current medical practices, but problems sometimes occur. Call your health care provider if you have any problems or questions after your procedure. °WHAT TO EXPECT AFTER THE PROCEDURE  °· Your urine may have a red tinge for a few days after treatment. Blood loss is usually minimal. °· You may have soreness in the back or flank area. This usually goes away after a few days. The procedure can cause blotches or bruises on the back where the pressure wave enters the skin. These marks usually cause only minimal discomfort and should disappear in a short time. °· Stone fragments should begin to pass within 24 hours of treatment. However, a delayed passage is not unusual. °· You may have pain, discomfort, and feel sick to your stomach (nauseated) when the crushed fragments of stone are passed down the tube from the kidney to the bladder. Stone fragments can pass soon after the procedure and may last for up to 4-8 weeks. °· A small number of patients may have severe pain when stone fragments are not able to pass, which leads to an obstruction. °· If your stone is greater than 1 inch (2.5 cm) in diameter or if you have multiple stones that have a combined diameter greater than 1 inch (2.5 cm), you may require more than one treatment. °· If you had a stent placed prior to your procedure, you may experience some discomfort, especially during urination. You may experience the pain or discomfort in your flank or back, or you may experience a sharp pain or discomfort at the base of your penis or in your lower abdomen. The discomfort usually lasts only a few minutes after urinating. °HOME CARE INSTRUCTIONS  °· Rest at home until you feel your energy  improving. °· Only take over-the-counter or prescription medicines for pain, discomfort, or fever as directed by your health care provider. Depending on the type of lithotripsy, you may need to take antibiotics and anti-inflammatory medicines for a few days. °· Drink enough water and fluids to keep your urine clear or pale yellow. This helps "flush" your kidneys. It helps pass any remaining pieces of stone and prevents stones from coming back. °· Most people can resume daily activities within 1-2 days after standard lithotripsy. It can take longer to recover from laser and percutaneous lithotripsy. °· Strain all urine through the provided strainer. Keep all particulate matter and stones for your health care provider to see. The stone may be as small as a grain of salt. It is very important to use the strainer each and every time you pass your urine. Any stones that are found can be sent to a medical lab for examination. °· Visit your health care provider for a follow-up appointment in a few weeks. Your doctor may remove your stent if you have one. Your health care provider will also check to see whether stone particles still remain. °SEEK MEDICAL CARE IF:  °· Your pain is not relieved by medicine. °· You have a lasting nauseous feeling. °· You feel there is too much blood in the urine. °· You develop persistent problems with frequent or painful urination that does not at least partially improve after 2 days following the procedure. °· You have a congested cough. °· You feel   lightheaded. °· You develop a rash or any other signs that might suggest an allergic problem. °· You develop any reaction or side effects to your medicine(s). °SEEK IMMEDIATE MEDICAL CARE IF:  °· You experience severe back or flank pain or both. °· You see nothing but blood when you urinate. °· You cannot pass any urine at all. °· You have a fever or shaking chills. °· You develop shortness of breath, difficulty breathing, or chest pain. °· You  develop vomiting that will not stop after 6-8 hours. °· You have a fainting episode. °  °This information is not intended to replace advice given to you by your health care provider. Make sure you discuss any questions you have with your health care provider. °  °Document Released: 08/01/2007 Document Revised: 04/02/2015 Document Reviewed: 01/25/2013 °Elsevier Interactive Patient Education ©2016 [employer]    ° ° ° °                                                                                                           Moderate Conscious Sedation, Adult, Care After °Refer to this sheet in the next few weeks. These instructions provide you with information on caring for yourself after your procedure. Your health care provider may also give you more specific instructions. Your treatment has been planned according to current medical practices, but problems sometimes occur. Call your health care provider if you have any problems or questions after your procedure. °WHAT TO EXPECT AFTER THE PROCEDURE  °After your procedure: °· You may feel sleepy, clumsy, and have poor balance for several hours. °· Vomiting may occur if you eat too soon after the procedure. °HOME CARE INSTRUCTIONS °· Do not participate in any activities where you could become injured for at least 24 hours. Do not: °¨ Drive. °¨ Swim. °¨ Ride a bicycle. °¨ Operate heavy machinery. °¨ Cook. °¨ Use power tools. °¨ Climb ladders. °¨ Work from a high place. °· Do not make important decisions or sign legal documents until you are improved. °· If you vomit, drink water, juice, or soup when you can drink without vomiting. Make sure you have little or no nausea before eating solid foods. °· Only take over-the-counter or prescription medicines for pain, discomfort, or fever as directed by your health care provider. °· Make sure you and your family fully understand everything about the medicines given to you, including what side effects may occur. °· You should  not drink alcohol, take sleeping pills, or take medicines that cause drowsiness for at least 24 hours. °· If you smoke, do not smoke without supervision. °· If you are feeling better, you may resume normal activities 24 hours after you were sedated. °· Keep all appointments with your health care provider. °SEEK MEDICAL CARE IF: °· Your skin is pale or bluish in color. °· You continue to feel nauseous or vomit. °· Your pain is getting worse and is not helped by medicine. °· You have bleeding or swelling. °· You are still sleepy or feeling clumsy after 24 hours. °SEEK   IMMEDIATE MEDICAL CARE IF: °· You develop a rash. °· You have difficulty breathing. °· You develop any type of allergic problem. °· You have a fever. °MAKE SURE YOU: °· Understand these instructions. °· Will watch your condition. °· Will get help right away if you are not doing well or get worse. °  °This information is not intended to replace advice given to you by your health care provider. Make sure you discuss any questions you have with your health care provider. °  °Document Released: 05/02/2013 Document Revised: 08/02/2014 Document Reviewed: 05/02/2013 °Elsevier Interactive Patient Education ©2016 [employer]. ° °

## 2015-10-16 NOTE — H&P (Signed)
Urology Admission H&P  Chief Complaint: Left flank pain  History of Present Illness: Ms Amanda Nielsen is a 44yo who developed severe, constant, sharp, nonradiating left flank pain. 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 She denies any LUTS  Past Medical History  Diagnosis Date  . Chronic back pain   . Hypertension   . High cholesterol   . Thyroid disease   . Coronary artery disease   . Morbid obesity [facility](HCC)    Past Surgical History  Procedure Laterality Date  . Cesarean section    . Thyroid surgery    . Tonsillectomy    . Thyroid surgery    . Cesarean section    . Coronary angioplasty with stent placement    . Joint replacement    . Total hip arthroplasty    . Tubal ligation    . Gastric bypass      Home Medications:  Prescriptions prior to admission  Medication Sig Dispense Refill Last Dose  . B Complex-C (B-COMPLEX WITH VITAMIN C) tablet Take 1 tablet by mouth daily.   10/15/2015 at 0800  . BIOTIN PO Take 1 tablet by mouth daily.   10/15/2015 at 0800  . calcium-vitamin D (OSCAL WITH D) 500-200 MG-UNIT tablet Take 1 tablet by mouth daily.   10/15/2015 at 0800  . colchicine 0.6 MG tablet Take 1 tablet (0.6 mg total) by mouth 2 (two) times daily. (Patient taking differently: Take 0.6 mg by mouth 2 (two) times daily as needed (gout). ) 60 tablet 0 Past Week at Unknown time  . Cyanocobalamin (VITAMIN B-12 PO) Take 1 tablet by mouth daily.   10/15/2015 at 0800  . ferrous sulfate 325 (65 FE) MG tablet Take 65 mg by mouth daily with breakfast.   10/15/2015 at 0800  . HYDROcodone-acetaminophen (NORCO) 5-325 MG tablet Take 2 tablets by mouth every 4 (four) hours as needed. 10 tablet 0 10/16/2015 at 0630  . lisinopril (PRINIVIL,ZESTRIL) 20 MG tablet Take 20 mg by mouth daily.   1 10/16/2015 at 0630  . metoprolol tartrate (LOPRESSOR) 25 MG tablet Take 25 mg by mouth 2 (two) times daily.     10/16/2015 at 0630  . Multiple Vitamin (MULTIVITAMIN WITH MINERALS) TABS tablet Take 1 tablet by  mouth daily.   10/15/2015 at 0800  . ondansetron (ZOFRAN ODT) 4 MG disintegrating tablet Take 1 tablet (4 mg total) by mouth every 8 (eight) hours as needed for nausea or vomiting. 20 tablet 0 Past Week at Unknown time  . ondansetron (ZOFRAN ODT) 4 MG disintegrating tablet 4mg  ODT q4 hours prn nausea/vomit 4 tablet 0 10/15/2015 at Unknown time   Allergies: No Known Allergies  History reviewed. No pertinent family history. Social History:  reports that she has never smoked. She does not have any smokeless tobacco history on file. She reports that she does not drink alcohol or use illicit drugs.  Review of Systems  Genitourinary: Positive for flank pain.  All other systems reviewed and are negative.   Physical Exam:  Vital signs in last 24 hours: Temp:  [98.2 F (36.8 C)-98.9 F (37.2 C)] 98.2 F (36.8 C) (03/23 1012) Pulse Rate:  [77-79] 77 (03/23 1012) Resp:  [18-20] 20 (03/23 1012) BP: (176)/(103) 176/103 mmHg (03/23 1012) SpO2:  [98 %-100 %] 100 % (03/23 1012) Weight:  [87.204 kg (192 lb 4 oz)] 87.204 kg (192 lb 4 oz) (03/23 0929) Physical Exam  Constitutional: She is oriented to person, place, and time. She appears well-developed and well-nourished.  HENT:  Head: Normocephalic and atraumatic.  Eyes: EOM are normal. Pupils are equal, round, and reactive to light.  Neck: Normal range of motion. No thyromegaly present.  Cardiovascular: Normal rate and regular rhythm.   Respiratory: Effort normal. No respiratory distress.  GI: Soft. She exhibits no distension.  Musculoskeletal: Normal range of motion.  Neurological: She is alert and oriented to person, place, and time.  Skin: Skin is warm and dry.  Psychiatric: She has a normal mood and affect. Her behavior is normal. Judgment and thought content normal.    Laboratory Data:  Results for orders placed or performed during the hospital encounter of 10/16/15 (from the past 24 hour(s))  Pregnancy, urine     Status: None    Collection Time: 10/16/15  9:35 AM  Result Value Ref Range   Preg Test, Ur NEGATIVE NEGATIVE   No results found for this or any previous visit (from the past 240 hour(s)). Creatinine:  Recent Labs  10/12/15 1856  CREATININE 1.67*   Baseline Creatinine: unknown  Impression/Assessment:  44yo with Left UPJ calculus  Plan:  The risks/benefits/alternatives to Left ESWL was explained to the patient and she understands and wishes to proceed with surgery  Nykeem Citro L 10/16/2015, 10:52 AM

## 2016-07-22 ENCOUNTER — Emergency Department (HOSPITAL_BASED_OUTPATIENT_CLINIC_OR_DEPARTMENT_OTHER)
Admission: EM | Admit: 2016-07-22 | Discharge: 2016-07-22 | Disposition: A | Discharge status: Home / Self Care | Payer: BLUE CROSS/BLUE SHIELD | Attending: Emergency Medicine | Admitting: Emergency Medicine

## 2016-07-22 ENCOUNTER — Encounter (HOSPITAL_BASED_OUTPATIENT_CLINIC_OR_DEPARTMENT_OTHER): Payer: Self-pay | Admitting: *Deleted

## 2016-07-22 DIAGNOSIS — A5901 Trichomonal vulvovaginitis: Secondary | ICD-10-CM | POA: Diagnosis not present

## 2016-07-22 DIAGNOSIS — A599 Trichomoniasis, unspecified: Secondary | ICD-10-CM

## 2016-07-22 DIAGNOSIS — I2510 Atherosclerotic heart disease of native coronary artery without angina pectoris: Secondary | ICD-10-CM | POA: Diagnosis not present

## 2016-07-22 DIAGNOSIS — I10 Essential (primary) hypertension: Secondary | ICD-10-CM | POA: Insufficient documentation

## 2016-07-22 DIAGNOSIS — Z79899 Other long term (current) drug therapy: Secondary | ICD-10-CM | POA: Diagnosis not present

## 2016-07-22 DIAGNOSIS — N760 Acute vaginitis: Secondary | ICD-10-CM

## 2016-07-22 DIAGNOSIS — N898 Other specified noninflammatory disorders of vagina: Secondary | ICD-10-CM | POA: Diagnosis present

## 2016-07-22 DIAGNOSIS — M2550 Pain in unspecified joint: Secondary | ICD-10-CM

## 2016-07-22 DIAGNOSIS — B9689 Other specified bacterial agents as the cause of diseases classified elsewhere: Secondary | ICD-10-CM

## 2016-07-22 LAB — URINALYSIS, ROUTINE W REFLEX MICROSCOPIC
Bilirubin Urine: NEGATIVE
Glucose, UA: NEGATIVE mg/dL
Ketones, ur: NEGATIVE mg/dL
Nitrite: NEGATIVE
Protein, ur: NEGATIVE mg/dL
Specific Gravity, Urine: 1.02 (ref 1.005–1.030)
pH: 5.5 (ref 5.0–8.0)

## 2016-07-22 LAB — URINALYSIS, MICROSCOPIC (REFLEX)

## 2016-07-22 LAB — WET PREP, GENITAL
Sperm: NONE SEEN
Yeast Wet Prep HPF POC: NONE SEEN

## 2016-07-22 LAB — PREGNANCY, URINE: Preg Test, Ur: NEGATIVE

## 2016-07-22 MED ORDER — AZITHROMYCIN 250 MG PO TABS
1000.0000 mg | ORAL_TABLET | Freq: Once | ORAL | Status: AC
  Administered 2016-07-22: 1000 mg via ORAL
  Filled 2016-07-22: qty 4

## 2016-07-22 MED ORDER — CEFTRIAXONE SODIUM 250 MG IJ SOLR
250.0000 mg | Freq: Once | INTRAMUSCULAR | Status: AC
  Administered 2016-07-22: 250 mg via INTRAMUSCULAR
  Filled 2016-07-22: qty 250

## 2016-07-22 MED ORDER — METRONIDAZOLE 500 MG PO TABS: 500.0000 mg | ORAL_TABLET | Freq: Two times a day (BID) | ORAL | 0 refills | Status: AC

## 2016-07-22 MED ORDER — ACETAMINOPHEN 325 MG PO TABS
650.0000 mg | ORAL_TABLET | Freq: Once | ORAL | Status: AC
  Administered 2016-07-22: 650 mg via ORAL
  Filled 2016-07-22: qty 2

## 2016-07-22 MED ORDER — METRONIDAZOLE 500 MG PO TABS
500.0000 mg | ORAL_TABLET | Freq: Once | ORAL | Status: AC
  Administered 2016-07-22: 500 mg via ORAL
  Filled 2016-07-22: qty 1

## 2016-07-22 NOTE — Discharge Instructions (Signed)
Read the information below.  You are being treated for trichomoniasis and bacterial vaginosis. Please notify sexual partners. No sexual activity for at least 7 days.  You can take tylenol for pain relief. Ice aching joints.  Please follow up with your primary doctor in 2-3 days for re-evaluation.  Use the prescribed medication as directed.  Please discuss all new medications with your pharmacist.   You may return to the Emergency Department at any time for worsening condition or any new symptoms that concern you. Return to ED if you develop fever, abdominal pain, pelvic pain, unable to keep food/fluids down, or any other new/concerning symptoms.

## 2016-07-22 NOTE — ED Triage Notes (Signed)
Lower back pain. Vaginal discharge. States all of her joints ache.

## 2016-07-22 NOTE — ED Provider Notes (Signed)
[facility] Provider Note   CSN: [id number] Arrival date & time: 07/22/16  1757  By signing my name below, I, Nelwyn SalisburyJoshua Fowler, attest that this documentation has been prepared under the direction and in the presence of non-physician practitioner, Arvilla MeresAshley Genny Caulder, PA-C. Electronically Signed: Nelwyn SalisburyJoshua Fowler, Scribe. 07/22/2016. 6:39 PM.  History   Chief Complaint Chief Complaint  Patient presents with  . Back Pain   The history is provided by the patient. No language interpreter was used.    HPI Comments:  Amanda Nielsen is a 45 y.o. female with pmhx of HTN, arthritis, and HLD who presents to the Emergency Department complaining of constant unchanged mild right sided back pain beginning a few weeks ago. She describes her symptoms as an aching pain that does not radiate. She reports associated swelling in left hand. No warmth or erythema. Pt reports associated diffuse right sided myalgias and arthralgias, nausea, white-yellow vaginal discharge. She denies any facial droop, numbness, weakness, syncope, vomiting, dysuria, hematuria, fever, abdominal pain, vaginal pain, pelvic pain, vaginal bleeding, loss of bowel or bladder control, or unexpected weight loss. Pt does not have periods due to her IUD. Last sexual encounter in Oct. No h/o cancer of IVDU. She has taken tylenol with minimal relief.   Past Medical History:  Diagnosis Date  . Chronic back pain   . Coronary artery disease   . High cholesterol   . Hypertension   . Morbid obesity (HCC)   . Thyroid disease     Patient Active Problem List   Diagnosis Date Noted  . Left foot pain 05/27/2015  . Right ankle injury 05/01/2015  . Essential (primary) hypertension 03/18/2015  . Bariatric surgery status 03/03/2015  . Morbid (severe) obesity due to excess calories (HCC) 03/03/2015  . Acid reflux 11/13/2014  . Arteriosclerosis of coronary artery 05/28/2014  . Dysmenorrhea 08/07/2013  . Irregular bleeding 08/07/2013  .  Absolute anemia 04/17/2013  . History of partial thyroidectomy 04/17/2013  . History of cesarean section 04/17/2013  . H/O total hip arthroplasty 04/17/2013  . History of surgical procedure 04/17/2013  . HLD (hyperlipidemia) 11/06/2012  . Sciatica 01/14/2012    Past Surgical History:  Procedure Laterality Date  . CESAREAN SECTION    . CESAREAN SECTION    . CORONARY ANGIOPLASTY WITH STENT PLACEMENT    . GASTRIC BYPASS    . JOINT REPLACEMENT    . THYROID SURGERY    . THYROID SURGERY    . TONSILLECTOMY    . TOTAL HIP ARTHROPLASTY    . TUBAL LIGATION      OB History    No data available       Home Medications    Prior to Admission medications   Medication Sig Start Date End Date Taking? Authorizing Provider  B Complex-C (B-COMPLEX WITH VITAMIN C) tablet Take 1 tablet by mouth daily.   Yes Historical Provider, MD  BIOTIN PO Take 1 tablet by mouth daily.   Yes Historical Provider, MD  calcium-vitamin D (OSCAL WITH D) 500-200 MG-UNIT tablet Take 1 tablet by mouth daily.   Yes Historical Provider, MD  Cyanocobalamin (VITAMIN B-12 PO) Take 1 tablet by mouth daily.   Yes Historical Provider, MD  ferrous sulfate 325 (65 FE) MG tablet Take 65 mg by mouth daily with breakfast.   Yes Historical Provider, MD  lisinopril (PRINIVIL,ZESTRIL) 20 MG tablet Take 20 mg by mouth daily.  03/05/15  Yes Historical Provider, MD  metoprolol tartrate (LOPRESSOR) 25 MG tablet Take 25 mg  by mouth 2 (two) times daily.     Yes Historical Provider, MD  Multiple Vitamin (MULTIVITAMIN WITH MINERALS) TABS tablet Take 1 tablet by mouth daily.   Yes Historical Provider, MD  colchicine 0.6 MG tablet Take 1 tablet (0.6 mg total) by mouth 2 (two) times daily. Patient taking differently: Take 0.6 mg by mouth 2 (two) times daily as needed (gout).  05/26/15   Lenda Kelp, MD  HYDROcodone-acetaminophen (NORCO) 5-325 MG tablet Take 2 tablets by mouth every 4 (four) hours as needed. 10/13/15   Blane Ohara, MD    metroNIDAZOLE (FLAGYL) 500 MG tablet Take 1 tablet (500 mg total) by mouth 2 (two) times daily. 07/22/16 07/29/16  Lona Kettle, PA-C  ondansetron (ZOFRAN ODT) 4 MG disintegrating tablet 4mg  ODT q4 hours prn nausea/vomit 10/13/15   Blane Ohara, MD    Family History No family history on file.  Social History Social History  Substance Use Topics  . Smoking status: Never Smoker  . Smokeless tobacco: Never Used  . Alcohol use No     Allergies   Patient has no known allergies.   Review of Systems Review of Systems  Constitutional: Negative for fever and unexpected weight change.  Gastrointestinal: Positive for nausea. Negative for abdominal pain and vomiting.  Genitourinary: Positive for vaginal discharge. Negative for dysuria, hematuria, pelvic pain, vaginal bleeding and vaginal pain.       Negative for Incontinence  Musculoskeletal: Positive for arthralgias, back pain and myalgias.  Neurological: Negative for syncope, facial asymmetry, weakness, numbness and headaches.  All other systems reviewed and are negative.    Physical Exam Updated Vital Signs BP 156/97 (BP Location: Right Arm)   Pulse 79   Temp 98.5 F (36.9 C) (Oral)   Resp 16   Ht 5\' 5"  (1.651 m)   Wt 87.1 kg   SpO2 100%   BMI 31.95 kg/m   Physical Exam  Constitutional: She is oriented to person, place, and time. She appears well-developed and well-nourished. No distress.  HENT:  Head: Normocephalic and atraumatic.  Mouth/Throat: Oropharynx is clear and moist. No oropharyngeal exudate.  Eyes: Conjunctivae and EOM are normal. Pupils are equal, round, and reactive to light. Right eye exhibits no discharge. Left eye exhibits no discharge. No scleral icterus.  Neck: Normal range of motion and phonation normal. Neck supple. No neck rigidity. Normal range of motion present.  Cardiovascular: Normal rate, regular rhythm, normal heart sounds and intact distal pulses.   No murmur heard. Pulmonary/Chest:  Effort normal and breath sounds normal. No stridor. No respiratory distress. She has no wheezes. She has no rales.  Abdominal: Soft. Bowel sounds are normal. She exhibits no distension. There is no tenderness. There is no rigidity, no rebound, no guarding and no CVA tenderness.  Genitourinary: Vagina normal. Pelvic exam was performed with patient supine. There is no rash, tenderness, lesion or injury on the right labia. There is no rash, tenderness, lesion or injury on the left labia. Cervix exhibits discharge. Cervix exhibits no motion tenderness and no friability. Right adnexum displays no mass and no tenderness. Left adnexum displays no mass and no tenderness.  Genitourinary Comments: Chaperone present for duration of exam. External anatomy normal - no injury, lesions, masses, or rashes. No bleeding, lesions, masses, or ulcerations in vaginal cavity. Cervix is closed with copious green discharge. No friability. No CMT, no adnexal tenderness, no masses palpated on bimanual exam.   Musculoskeletal: Normal range of motion.  No tenderness to palpation of joints.  No obvious swelling, warmth, or erythema. Moves all extremities with ease.   Lymphadenopathy:    She has no cervical adenopathy.  Neurological: She is alert and oriented to person, place, and time. She is not disoriented. Coordination and gait normal. GCS eye subscore is 4. GCS verbal subscore is 5. GCS motor subscore is 6.  Mental Status:  Alert, thought content appropriate, able to give a coherent history. Speech fluent without evidence of aphasia. Able to follow 2 step commands without difficulty.  Cranial Nerves:  II:  Peripheral visual fields grossly normal, pupils equal, round, reactive to light III,IV, VI: ptosis not present, extra-ocular motions intact bilaterally  V,VII: smile symmetric, facial light touch sensation equal VIII: hearing grossly normal to voice  X: uvula elevates symmetrically  XI: bilateral shoulder shrug symmetric  and strong XII: midline tongue extension without fassiculations Motor:  Normal tone. 5/5 in upper and lower extremities bilaterally including strong and equal grip strength and dorsiflexion/plantar flexion Sensory: light touch normal in all extremities. Cerebellar: normal finger-to-nose with bilateral upper extremities Gait: normal gait and balance CV: distal pulses palpable throughout  Skin: Skin is warm and dry. She is not diaphoretic.  Psychiatric: She has a normal mood and affect. Her behavior is normal. Judgment normal.  Nursing note and vitals reviewed.    ED Treatments / Results  DIAGNOSTIC STUDIES:  Oxygen Saturation is 100% on RA, normal by my interpretation.    COORDINATION OF CARE:  6:49 PM Discussed treatment plan with pt at bedside which included blood work and pt agreed to plan.  9:04 PM Updated pt on results of lab work   Labs (all labs ordered are listed, but only abnormal results are displayed) Labs Reviewed  WET PREP, GENITAL - Abnormal; Notable for the following:       Result Value   Trich, Wet Prep PRESENT (*)    Clue Cells Wet Prep HPF POC PRESENT (*)    WBC, Wet Prep HPF POC MANY (*)    All other components within normal limits  URINALYSIS, ROUTINE W REFLEX MICROSCOPIC - Abnormal; Notable for the following:    APPearance CLOUDY (*)    Hgb urine dipstick SMALL (*)    Leukocytes, UA LARGE (*)    All other components within normal limits  URINALYSIS, MICROSCOPIC (REFLEX) - Abnormal; Notable for the following:    Bacteria, UA MANY (*)    Squamous Epithelial / LPF 0-5 (*)    All other components within normal limits  PREGNANCY, URINE  GC/CHLAMYDIA PROBE AMP ([place]) NOT AT [facility]    EKG  EKG Interpretation None       Radiology No results found.  Procedures Procedures (including critical care time)  Medications Ordered in ED Medications  cefTRIAXone (ROCEPHIN) injection 250 mg (250 mg Intramuscular Given 07/22/16 2118)  azithromycin  (ZITHROMAX) tablet 1,000 mg (1,000 mg Oral Given 07/22/16 2117)  metroNIDAZOLE (FLAGYL) tablet 500 mg (500 mg Oral Given 07/22/16 2116)     Initial Impression / Assessment and Plan / ED Course  I have reviewed the triage vital signs and the nursing notes.  Pertinent labs & imaging results that were available during my care of the patient were reviewed by me and considered in my medical decision making (see chart for details).  Clinical Course as of Jul 23 2123  Thu Jul 22, 2016  1903 Hgb urine dipstick: (!) SMALL [RT]    Clinical Course User Index [RT] Linna DarnerRussell Turner    Patient presents to ED with  complaint of myalgias, arthralgias, and vaginal discharge x couple weeks. Patient is afebrile and non-toxic appearing in NAD. Vital signs remarkable for hypertension, otherwise stable. Heart RRR. Lungs CTABL. No TTP of joints, no warmth, erythema, or swelling appreciated. No focal neuro deficits on exam. ?arthralgias secondary to arthritis. Abdomen soft, non-tender, non-distended. No CVA tenderness. Copious green discharge on pelvic exam. No CMT. No adnexal tenderness. U/A remarkable for  Leukocytes and trichomoniasis. Wet prep remarkable for BV and trichomoniasis. Upreg negative. GC/chlamydia pending. Low suspicion at this time of PID - afebrile, no CMT or adnexal tenderness, no abdominal tenderness. Discussed with pt results. Pt would like to also be empirically treated for GC/chlamydia. IM ceftriaxone, PO azithromycin, and 1st dose metronidazole given. Rx metronidazole for tx of trichomoniasis and BV. Notify sexual partners. Follow up with PCP in 2-3 days for re-evaluation. Strict return precautions given. Pt voiced understanding and is agreeable.   Final Clinical Impressions(s) / ED Diagnoses   Final diagnoses:  Arthralgia, unspecified joint  Trichomoniasis  Bacterial vaginosis    New Prescriptions New Prescriptions   METRONIDAZOLE (FLAGYL) 500 MG TABLET    Take 1 tablet (500 mg total) by  mouth 2 (two) times daily.  I personally performed the services described in this documentation, which was scribed in my presence. The recorded information has been reviewed and is accurate.    Lona Kettle, [place] 07/22/16 2124    Marily Memos, MD 07/22/16 2229

## 2016-07-22 NOTE — ED Notes (Signed)
EDP at bedside performing pelvic exam with EMT

## 2016-07-29 LAB — GC/CHLAMYDIA PROBE AMP (~~LOC~~) NOT AT ARMC
CHLAMYDIA, DNA PROBE: NEGATIVE
NEISSERIA GONORRHEA: NEGATIVE

## 2016-08-15 ENCOUNTER — Encounter (HOSPITAL_BASED_OUTPATIENT_CLINIC_OR_DEPARTMENT_OTHER): Payer: Self-pay | Admitting: Emergency Medicine

## 2016-08-15 ENCOUNTER — Emergency Department (HOSPITAL_BASED_OUTPATIENT_CLINIC_OR_DEPARTMENT_OTHER): Payer: BLUE CROSS/BLUE SHIELD

## 2016-08-15 ENCOUNTER — Emergency Department (HOSPITAL_BASED_OUTPATIENT_CLINIC_OR_DEPARTMENT_OTHER)
Admission: EM | Admit: 2016-08-15 | Discharge: 2016-08-15 | Disposition: A | Discharge status: Home / Self Care | Payer: BLUE CROSS/BLUE SHIELD | Attending: Emergency Medicine | Admitting: Emergency Medicine

## 2016-08-15 DIAGNOSIS — I10 Essential (primary) hypertension: Secondary | ICD-10-CM | POA: Insufficient documentation

## 2016-08-15 DIAGNOSIS — R1011 Right upper quadrant pain: Secondary | ICD-10-CM

## 2016-08-15 DIAGNOSIS — K8020 Calculus of gallbladder without cholecystitis without obstruction: Secondary | ICD-10-CM | POA: Diagnosis not present

## 2016-08-15 DIAGNOSIS — I2510 Atherosclerotic heart disease of native coronary artery without angina pectoris: Secondary | ICD-10-CM | POA: Diagnosis not present

## 2016-08-15 DIAGNOSIS — Z79899 Other long term (current) drug therapy: Secondary | ICD-10-CM | POA: Insufficient documentation

## 2016-08-15 DIAGNOSIS — R079 Chest pain, unspecified: Secondary | ICD-10-CM | POA: Insufficient documentation

## 2016-08-15 DIAGNOSIS — M549 Dorsalgia, unspecified: Secondary | ICD-10-CM | POA: Insufficient documentation

## 2016-08-15 DIAGNOSIS — Z955 Presence of coronary angioplasty implant and graft: Secondary | ICD-10-CM | POA: Diagnosis not present

## 2016-08-15 LAB — CBC
HCT: 35.9 % — ABNORMAL LOW (ref 36.0–46.0)
Hemoglobin: 12 g/dL (ref 12.0–15.0)
MCH: 31 pg (ref 26.0–34.0)
MCHC: 33.4 g/dL (ref 30.0–36.0)
MCV: 92.8 fL (ref 78.0–100.0)
PLATELETS: 219 10*3/uL (ref 150–400)
RBC: 3.87 MIL/uL (ref 3.87–5.11)
RDW: 12.6 % (ref 11.5–15.5)
WBC: 3.1 10*3/uL — ABNORMAL LOW (ref 4.0–10.5)

## 2016-08-15 LAB — COMPREHENSIVE METABOLIC PANEL
ALK PHOS: 61 U/L (ref 38–126)
ALT: 29 U/L (ref 14–54)
AST: 28 U/L (ref 15–41)
Albumin: 4 g/dL (ref 3.5–5.0)
Anion gap: 7 (ref 5–15)
BILIRUBIN TOTAL: 1 mg/dL (ref 0.3–1.2)
BUN: 12 mg/dL (ref 6–20)
CALCIUM: 8.9 mg/dL (ref 8.9–10.3)
CO2: 28 mmol/L (ref 22–32)
CREATININE: 0.9 mg/dL (ref 0.44–1.00)
Chloride: 106 mmol/L (ref 101–111)
GFR calc Af Amer: >60 mL/min (ref >60)
GFR calc non Af Amer: >60 mL/min (ref >60)
GLUCOSE: 71 mg/dL (ref 65–99)
Potassium: 3.5 mmol/L (ref 3.5–5.1)
SODIUM: 141 mmol/L (ref 135–145)
TOTAL PROTEIN: 7.3 g/dL (ref 6.5–8.1)

## 2016-08-15 LAB — URINALYSIS, MICROSCOPIC (REFLEX)

## 2016-08-15 LAB — URINALYSIS, ROUTINE W REFLEX MICROSCOPIC
BILIRUBIN URINE: NEGATIVE
Glucose, UA: NEGATIVE mg/dL
KETONES UR: NEGATIVE mg/dL
Nitrite: NEGATIVE
PROTEIN: NEGATIVE mg/dL
Specific Gravity, Urine: 1.021 (ref 1.005–1.030)
pH: 6 (ref 5.0–8.0)

## 2016-08-15 LAB — PREGNANCY, URINE: PREG TEST UR: NEGATIVE

## 2016-08-15 LAB — LIPASE, BLOOD: Lipase: 46 U/L (ref 11–51)

## 2016-08-15 LAB — TROPONIN I: Troponin I: <0.03 ng/mL (ref <0.03)

## 2016-08-15 MED ORDER — ONDANSETRON 4 MG PO TBDP
4.0000 mg | ORAL_TABLET | Freq: Once | ORAL | Status: AC | PRN
  Administered 2016-08-15: 4 mg via ORAL

## 2016-08-15 MED ORDER — ONDANSETRON 4 MG PO TBDP
ORAL_TABLET | ORAL | Status: AC
  Filled 2016-08-15: qty 1

## 2016-08-15 MED ORDER — GI COCKTAIL ~~LOC~~
30.0000 mL | Freq: Once | ORAL | Status: AC
  Administered 2016-08-15: 30 mL via ORAL
  Filled 2016-08-15: qty 30

## 2016-08-15 MED ORDER — HYDROCODONE-ACETAMINOPHEN 5-325 MG PO TABS: 1.0000 | ORAL_TABLET | ORAL | 0 refills | Status: DC | PRN

## 2016-08-15 MED ORDER — ONDANSETRON 4 MG PO TBDP: ORAL_TABLET | ORAL | 0 refills | Status: DC

## 2016-08-15 NOTE — ED Triage Notes (Signed)
Patient c/o upper abd pain, radiates into her chest, and back. Pain started a few days ago. C/o nausea, denies constipation or diarrhea.

## 2016-08-15 NOTE — ED Notes (Signed)
Pt teaching provided on medications that may cause drowsiness. Pt instructed not to drive or operate heavy machinery while taking the prescribed medication. Pt verbalized understanding.   

## 2016-08-15 NOTE — ED Provider Notes (Signed)
[facility]   CSN: [id number] Arrival date & time: 08/15/16  1610  By signing my name below, I, Rosario Adie, attest that this documentation has been prepared under the direction and in the presence of Rolan Bucco, MD. Electronically Signed: Rosario Adie, ED Scribe. 08/15/16. 9:14 PM.  History   Chief Complaint Chief Complaint  Patient presents with  . Abdominal Pain    radiates into chest   The history is provided by the patient. No language interpreter was used.    HPI Comments: Gila Lauf is a 46 y.o. female with a h/o obesity, HTN, CAD, who presents to the Emergency Department complaining of intermittent, sharp and stabbing upper abdominal pain onset two days ago. She notes radiation of her pain upwards into her chest and through to her back. Pt notes associated nausea secondary to her episodes of pain. She states that each episode of pain is only present and exacerbated post-prandial, and each episode of pain last for a few hours each time. She has been taking Tylenol without relief during her episodes of pain. Pt has a PSHx to the abdomen including gastric bypass, tubal ligation, and two caesarian sections. Denies urgency, frequency, hematuria, dysuria, difficulty urinating, fever, shortness of breath, vomiting, diarrhea, constipation, or any other associated symptoms.   Past Medical History:  Diagnosis Date  . Chronic back pain   . Coronary artery disease   . High cholesterol   . Hypertension   . Morbid obesity (HCC)   . Thyroid disease    Patient Active Problem List   Diagnosis Date Noted  . Left foot pain 05/27/2015  . Right ankle injury 05/01/2015  . Essential (primary) hypertension 03/18/2015  . Bariatric surgery status 03/03/2015  . Morbid (severe) obesity due to excess calories (HCC) 03/03/2015  . Acid reflux 11/13/2014  . Arteriosclerosis of coronary artery 05/28/2014  . Dysmenorrhea 08/07/2013  . Irregular  bleeding 08/07/2013  . Absolute anemia 04/17/2013  . History of partial thyroidectomy 04/17/2013  . History of cesarean section 04/17/2013  . H/O total hip arthroplasty 04/17/2013  . History of surgical procedure 04/17/2013  . HLD (hyperlipidemia) 11/06/2012  . Sciatica 01/14/2012   Past Surgical History:  Procedure Laterality Date  . CESAREAN SECTION    . CESAREAN SECTION    . CORONARY ANGIOPLASTY WITH STENT PLACEMENT    . GASTRIC BYPASS    . JOINT REPLACEMENT    . THYROID SURGERY    . THYROID SURGERY    . TONSILLECTOMY    . TOTAL HIP ARTHROPLASTY    . TUBAL LIGATION     OB History    No data available     Home Medications    Prior to Admission medications   Medication Sig Start Date End Date Taking? Authorizing Provider  B Complex-C (B-COMPLEX WITH VITAMIN C) tablet Take 1 tablet by mouth daily.   Yes Historical Provider, MD  BIOTIN PO Take 1 tablet by mouth daily.   Yes Historical Provider, MD  calcium-vitamin D (OSCAL WITH D) 500-200 MG-UNIT tablet Take 1 tablet by mouth daily.   Yes Historical Provider, MD  Cyanocobalamin (VITAMIN B-12 PO) Take 1 tablet by mouth daily.   Yes Historical Provider, MD  ferrous sulfate 325 (65 FE) MG tablet Take 65 mg by mouth daily with breakfast.   Yes Historical Provider, MD  lisinopril (PRINIVIL,ZESTRIL) 20 MG tablet Take 20 mg by mouth daily.  03/05/15  Yes Historical Provider, MD  metoprolol tartrate (LOPRESSOR) 25 MG  tablet Take 25 mg by mouth 2 (two) times daily.     Yes Historical Provider, MD  Multiple Vitamin (MULTIVITAMIN WITH MINERALS) TABS tablet Take 1 tablet by mouth daily.   Yes Historical Provider, MD  colchicine 0.6 MG tablet Take 1 tablet (0.6 mg total) by mouth 2 (two) times daily. Patient taking differently: Take 0.6 mg by mouth 2 (two) times daily as needed (gout).  05/26/15   Lenda Kelp, MD  HYDROcodone-acetaminophen (NORCO) 5-325 MG tablet Take 1-2 tablets by mouth every 4 (four) hours as needed. 08/15/16   Rolan Bucco, MD  ondansetron (ZOFRAN ODT) 4 MG disintegrating tablet 4mg  ODT q4 hours prn nausea/vomit 08/15/16   Rolan Bucco, MD   Family History History reviewed. No pertinent family history.  Social History Social History  Substance Use Topics  . Smoking status: Never Smoker  . Smokeless tobacco: Never Used  . Alcohol use No   Allergies   Patient has no known allergies.  Review of Systems Review of Systems  Constitutional: Negative for fever.  Cardiovascular: Positive for chest pain (2/2 radiation).  Gastrointestinal: Positive for abdominal pain and nausea. Negative for constipation, diarrhea and vomiting.  Genitourinary: Negative for difficulty urinating, dysuria, frequency, hematuria and urgency.  Musculoskeletal: Positive for back pain (2/2 radiation).  All other systems reviewed and are negative.  Physical Exam Updated Vital Signs BP (!) 159/101   Pulse 66   Temp 98.3 F (36.8 C) (Oral)   Resp 18   Ht 5\' 5"  (1.651 m)   Wt 190 lb (86.2 kg)   SpO2 100%   BMI 31.62 kg/m   Physical Exam  Constitutional: She is oriented to person, place, and time. She appears well-developed and well-nourished.  HENT:  Head: Normocephalic and atraumatic.  Eyes: Pupils are equal, round, and reactive to light.  Neck: Normal range of motion. Neck supple.  Cardiovascular: Normal rate, regular rhythm and normal heart sounds.   Pulmonary/Chest: Effort normal and breath sounds normal. No respiratory distress. She has no wheezes. She has no rales. She exhibits no tenderness.  Abdominal: Soft. Bowel sounds are normal. There is tenderness. There is no rebound and no guarding.  TTP to the RUQ and epigastrium.   Musculoskeletal: Normal range of motion. She exhibits no edema.  Lymphadenopathy:    She has no cervical adenopathy.  Neurological: She is alert and oriented to person, place, and time.  Skin: Skin is warm and dry. No rash noted.  Psychiatric: She has a normal mood and affect.   ED  Treatments / Results  DIAGNOSTIC STUDIES: Oxygen Saturation is 100% on RA, normal by my interpretation.   COORDINATION OF CARE: 7:37 PM-Discussed next steps with pt. Pt verbalized understanding and is agreeable with the plan.   Labs (all labs ordered are listed, but only abnormal results are displayed) Labs Reviewed  CBC - Abnormal; Notable for the following:       Result Value   WBC 3.1 (*)    HCT 35.9 (*)    All other components within normal limits  URINALYSIS, ROUTINE W REFLEX MICROSCOPIC - Abnormal; Notable for the following:    APPearance CLOUDY (*)    Hgb urine dipstick SMALL (*)    Leukocytes, UA SMALL (*)    All other components within normal limits  URINALYSIS, MICROSCOPIC (REFLEX) - Abnormal; Notable for the following:    Bacteria, UA FEW (*)    Squamous Epithelial / LPF 6-30 (*)    All other components within normal limits  LIPASE, BLOOD  COMPREHENSIVE METABOLIC PANEL  TROPONIN I  PREGNANCY, URINE   EKG  EKG Interpretation  Date/Time:  Sunday August 15 2016 17:05:50 EST Ventricular Rate:  74 PR Interval:  168 QRS Duration: 74 QT Interval:  372 QTC Calculation: 412 R Axis:   55 Text Interpretation:  Normal sinus rhythm Cannot rule out Anterior infarct , age undetermined Abnormal ECG Confirmed by Kamela Blansett  MD, Adom Schoeneck ([id number]) on 08/15/2016 5:34:48 PM      Radiology [place] Abdomen Limited Ruq  Result Date: 08/15/2016 CLINICAL DATA:  Right upper quadrant pain after meals for 1 week with nausea. Prior gastric bypass. EXAM: US ABDOMEN LIMITED - RIGHT UPPER QUADRANT COMPARISON:  CT abdomen and pelvis 10/12/2015 FINDINGS: Gallbladder: Gallbladder sludge with possible small stones measuring up to 6 mm in size. No gallbladder wall thickening or pericholecystic fluid. Negative sonographic Murphy sign per the technologist. Common bile duct: Diameter: 3 mm Liver: No focal lesion identified. Within normal limits in parenchymal echogenicity. IMPRESSION: 1. Gallbladder sludge and  possible small stones without evidence of acute cholecystitis. 2. No biliary dilatation.  Unremarkable liver. Electronically Signed   By: Sebastian AcheAllen  Grady M.D.   On: 08/15/2016 20:38    Procedures Procedures  Medications Ordered in ED Medications  ondansetron (ZOFRAN-ODT) disintegrating tablet 4 mg (4 mg Oral Given 08/15/16 1714)  gi cocktail (Maalox,Lidocaine,Donnatal) (30 mLs Oral Given 08/15/16 2001)   Initial Impression / Assessment and Plan / ED Course  I have reviewed the triage vital signs and the nursing notes.  Pertinent labs & imaging results that were available during my care of the patient were reviewed by me and considered in my medical decision making (see chart for details).     Patient presents with epigastric and right upper quadrant pain that's worse after eating. Her ultrasound shows evidence of gallstones. She doesn't have any signs of cholecystitis. Her pain is well controlled in the ED. She was discharged home in good condition. She was given instructions for a fat-free diet. She was encouraged to have close follow-up with [facility]. She was given strict return precautions. She was given prescriptions for Vicodin and Zofran for symptomatically.  Final Clinical Impressions(s) / ED Diagnoses   Final diagnoses:  RUQ pain  Gallstones   New Prescriptions Current Discharge Medication List     I personally performed the services described in this documentation, which was scribed in my presence.  The recorded information has been reviewed and considered.     Rolan BuccoMelanie Tiauna Whisnant, MD 08/15/16 2115

## 2016-11-03 ENCOUNTER — Encounter (HOSPITAL_BASED_OUTPATIENT_CLINIC_OR_DEPARTMENT_OTHER): Payer: Self-pay

## 2016-11-03 ENCOUNTER — Emergency Department (HOSPITAL_BASED_OUTPATIENT_CLINIC_OR_DEPARTMENT_OTHER)
Admission: EM | Admit: 2016-11-03 | Discharge: 2016-11-03 | Disposition: A | Discharge status: Home / Self Care | Payer: BLUE CROSS/BLUE SHIELD | Attending: Emergency Medicine | Admitting: Emergency Medicine

## 2016-11-03 DIAGNOSIS — M545 Low back pain, unspecified: Secondary | ICD-10-CM

## 2016-11-03 DIAGNOSIS — I10 Essential (primary) hypertension: Secondary | ICD-10-CM | POA: Insufficient documentation

## 2016-11-03 DIAGNOSIS — G8929 Other chronic pain: Secondary | ICD-10-CM | POA: Insufficient documentation

## 2016-11-03 DIAGNOSIS — Z79899 Other long term (current) drug therapy: Secondary | ICD-10-CM | POA: Insufficient documentation

## 2016-11-03 DIAGNOSIS — I2510 Atherosclerotic heart disease of native coronary artery without angina pectoris: Secondary | ICD-10-CM | POA: Diagnosis not present

## 2016-11-03 MED ORDER — HYDROCODONE-ACETAMINOPHEN 5-325 MG PO TABS: 1.0000 | ORAL_TABLET | ORAL | 0 refills | Status: DC | PRN

## 2016-11-03 MED ORDER — METHOCARBAMOL 500 MG PO TABS: 500.0000 mg | ORAL_TABLET | Freq: Two times a day (BID) | ORAL | 0 refills | Status: DC

## 2016-11-03 NOTE — ED Notes (Signed)
Pt reports chronic back pain, states all she can take is tylenol due to weight loss surgery and this is not working.

## 2016-11-03 NOTE — Discharge Instructions (Signed)
Return to the ED with any concerns including weakness of legs, not able to urinate, loss of control of bowel or bladder, fever/chills, decreased level of alertness/lethargy, or any other alarming symptoms °

## 2016-11-03 NOTE — ED Provider Notes (Signed)
[facility] Provider Note   CSN: [id number] Arrival date & time: 11/03/16  1753  By signing my name below, I, Doreatha Martin, attest that this documentation has been prepared under the direction and in the presence of Jerelyn Scott, MD. Electronically Signed: Doreatha Martin, ED Scribe. 11/03/16. 7:00 PM.     History   Chief Complaint Chief Complaint  Patient presents with  . Back Pain    HPI Amanda Nielsen is a 46 y.o. female s/p left hip replacement 4 years ago who presents to the Emergency Department complaining of moderate, constant, gradually worsening, acute on chronic right lower back pain that began months ago. Pt denies recent trauma, injury, heavy lifting, falls. She states her pain is worsened in certain positions and with movement, and unrelieved with Tylenol. Pt was seen by her PCP for the issue and was prescribed Hydrocodone which has provided her no relief. 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 Pt is ambulatory with minimal difficulty. She denies bowel or bladder incontinence, saddle anesthesia, numbness, dysuria, hematuria, frequency, urgency, fever, focal weakness or paresthesia of the lower extremities.    The history is provided by the patient. No language interpreter was used.  Back Pain   This is a chronic problem. The current episode started 12 to 24 hours ago. The problem occurs constantly. The problem has been gradually worsening. The pain is associated with no known injury. The pain is present in the lumbar spine. The pain does not radiate. The pain is moderate. The symptoms are aggravated by certain positions and bending. The pain is the same all the time. Pertinent negatives include no fever, no numbness, no bowel incontinence, no perianal numbness, no bladder incontinence, no dysuria, no paresthesias and no weakness. She has tried NSAIDs for the symptoms. The treatment provided no relief.    Past Medical History:    Diagnosis Date  . Chronic back pain   . Coronary artery disease   . High cholesterol   . Hypertension   . Morbid obesity (HCC)   . Thyroid disease     Patient Active Problem List   Diagnosis Date Noted  . Left foot pain 05/27/2015  . Right ankle injury 05/01/2015  . Essential (primary) hypertension 03/18/2015  . Bariatric surgery status 03/03/2015  . Morbid (severe) obesity due to excess calories (HCC) 03/03/2015  . Acid reflux 11/13/2014  . Arteriosclerosis of coronary artery 05/28/2014  . Dysmenorrhea 08/07/2013  . Irregular bleeding 08/07/2013  . Absolute anemia 04/17/2013  . History of partial thyroidectomy 04/17/2013  . History of cesarean section 04/17/2013  . H/O total hip arthroplasty 04/17/2013  . History of surgical procedure 04/17/2013  . HLD (hyperlipidemia) 11/06/2012  . Sciatica 01/14/2012    Past Surgical History:  Procedure Laterality Date  . CESAREAN SECTION    . CESAREAN SECTION    . CORONARY ANGIOPLASTY WITH STENT PLACEMENT    . GASTRIC BYPASS    . JOINT REPLACEMENT    . THYROID SURGERY    . THYROID SURGERY    . TONSILLECTOMY    . TOTAL HIP ARTHROPLASTY    . TUBAL LIGATION      OB History    No data available       Home Medications    Prior to Admission medications   Medication Sig Start Date End Date Taking? Authorizing Provider  HYDRALAZINE-HCTZ PO Take by mouth.   Yes Historical Provider, MD  B Complex-C (B-COMPLEX WITH VITAMIN C)  tablet Take 1 tablet by mouth daily.    Historical Provider, MD  BIOTIN PO Take 1 tablet by mouth daily.    Historical Provider, MD  calcium-vitamin D (OSCAL WITH D) 500-200 MG-UNIT tablet Take 1 tablet by mouth daily.    Historical Provider, MD  colchicine 0.6 MG tablet Take 1 tablet (0.6 mg total) by mouth 2 (two) times daily. Patient taking differently: Take 0.6 mg by mouth 2 (two) times daily as needed (gout).  05/26/15   Lenda Kelp, MD  Cyanocobalamin (VITAMIN B-12 PO) Take 1 tablet by mouth daily.     Historical Provider, MD  ferrous sulfate 325 (65 FE) MG tablet Take 65 mg by mouth daily with breakfast.    Historical Provider, MD  HYDROcodone-acetaminophen (NORCO/VICODIN) 5-325 MG tablet Take 1 tablet by mouth every 4 (four) hours as needed. 11/03/16   Jerelyn Scott, MD  lisinopril (PRINIVIL,ZESTRIL) 20 MG tablet Take 20 mg by mouth daily.  03/05/15   Historical Provider, MD  methocarbamol (ROBAXIN) 500 MG tablet Take 1 tablet (500 mg total) by mouth 2 (two) times daily. 11/03/16   Jerelyn Scott, MD  metoprolol tartrate (LOPRESSOR) 25 MG tablet Take 25 mg by mouth 2 (two) times daily.      Historical Provider, MD  Multiple Vitamin (MULTIVITAMIN WITH MINERALS) TABS tablet Take 1 tablet by mouth daily.    Historical Provider, MD  ondansetron (ZOFRAN ODT) 4 MG disintegrating tablet  ODT q4 hours prn nausea/vomit 08/15/16   Rolan Bucco, MD    Family History No family history on file.  Social History Social History  Substance Use Topics  . Smoking status: Never Smoker  . Smokeless tobacco: Never Used  . Alcohol use No     Allergies   Patient has no known allergies.   Review of Systems Review of Systems  Constitutional: Negative for fever.  Gastrointestinal: Negative for bowel incontinence.  Genitourinary: Negative for bladder incontinence, dysuria, frequency, hematuria and urgency.  Musculoskeletal: Positive for back pain.  Neurological: Negative for weakness, numbness and paresthesias.  All other systems reviewed and are negative.    Physical Exam Updated Vital Signs BP (!) 160/95 (BP Location: Left Arm)   Pulse 70   Temp 98.7 F (37.1 C) (Oral)   Resp 18   Ht  (1.651 m)   Wt 185 lb (83.9 kg)   SpO2 99%   BMI 30.79 kg/m  Vitals reviewed Physical Exam Physical Examination: General appearance - alert, well appearing, and in no distress Mental status - alert, oriented to person, place, and time Eyes - no conjunctival injection, no scleral icterus Chest -  clear to auscultation, no wheezes, rales or rhonchi, symmetric air entry Heart - normal rate, regular rhythm, normal S1, S2, no murmurs, rubs, clicks or gallops Back exam - no midline tenderness to palpation to c/t/l spine, no CVA tenderness, mild paraspinal tenderness to right side Neurological - alert, oriented, normal speech, strength 5/5 in extremities x 4, sensation intact Musculoskeletal - no joint tenderness, deformity or swelling Extremities - peripheral pulses normal, no pedal edema, no clubbing or cyanosis Skin - normal coloration and turgor, no rashes  ED Treatments / Results   DIAGNOSTIC STUDIES: Oxygen Saturation is 99% on RA, normal by my interpretation.    COORDINATION OF CARE: 6:56 PM Discussed treatment plan with pt at bedside which includes muscle relaxer and pt agreed to plan.    Labs (all labs ordered are listed, but only abnormal results are displayed) Labs Reviewed -  No data to display  EKG  EKG Interpretation None       Radiology No results found.  Procedures Procedures (including critical care time)  Medications Ordered in ED Medications - No data to display   Initial Impression / Assessment and Plan / ED Course  I have reviewed the triage vital signs and the nursing notes.  Pertinent labs & imaging results that were available during my care of the patient were reviewed by me and considered in my medical decision making (see chart for details).     Pt presenting c/o right lower back pain, no midline tenderness to palpation, no CVA tenderness.  Pt has paraspinal tenderness to palpation.  No signs or symptoms of cauda equina, no fever to suggest epidural abscess. Pt given rx for pain control and muscle relaxer.  Pt advised to f/u with PMD- may need outpatient MRI if symptoms continue.  Discharged with strict return precautions.  Pt agreeable with plan.  Final Clinical Impressions(s) / ED Diagnoses   Final diagnoses:  Chronic right-sided low back  pain without sciatica    New Prescriptions Discharge Medication List as of 11/03/2016  7:04 PM    START taking these medications   Details  HYDROcodone-acetaminophen (NORCO/VICODIN) 5-325 MG tablet Take 1 tablet by mouth every 4 (four) hours as needed., Starting Wed 11/03/2016, Print    methocarbamol (ROBAXIN) 500 MG tablet Take 1 tablet (500 mg total) by mouth 2 (two) times daily., Starting Wed 11/03/2016, Print        I personally performed the services described in this documentation, which was scribed in my presence. The recorded information has been reviewed and is accurate.     Jerelyn Scott, MD 11/04/16 [phone]

## 2016-11-03 NOTE — ED Triage Notes (Signed)
c/o right side lower back pain x 2 months denies injury-NAD-steady gait

## 2017-03-15 IMAGING — CR DG ANKLE COMPLETE 3+V*R*
3 series · 3 of 3 positions shown · non-contrast
Comparison: Right foot 10/03/2013

CLINICAL DATA: Right ankle pain for 1 day, swelling over the
lateral ankle

EXAM:
RIGHT ANKLE - COMPLETE 3+ VIEW

[t ankle joint ap right]
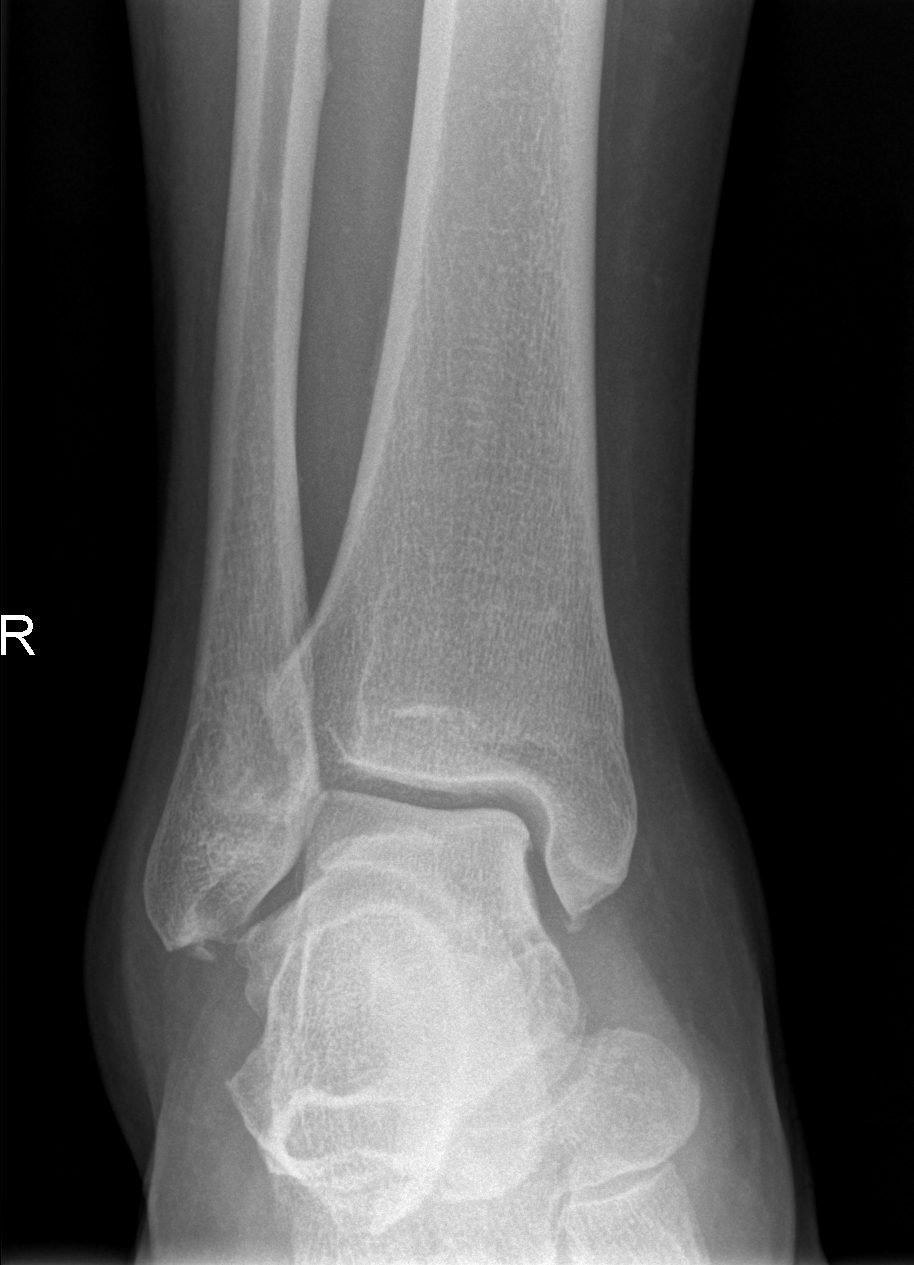

[t ankle joint oblique right]
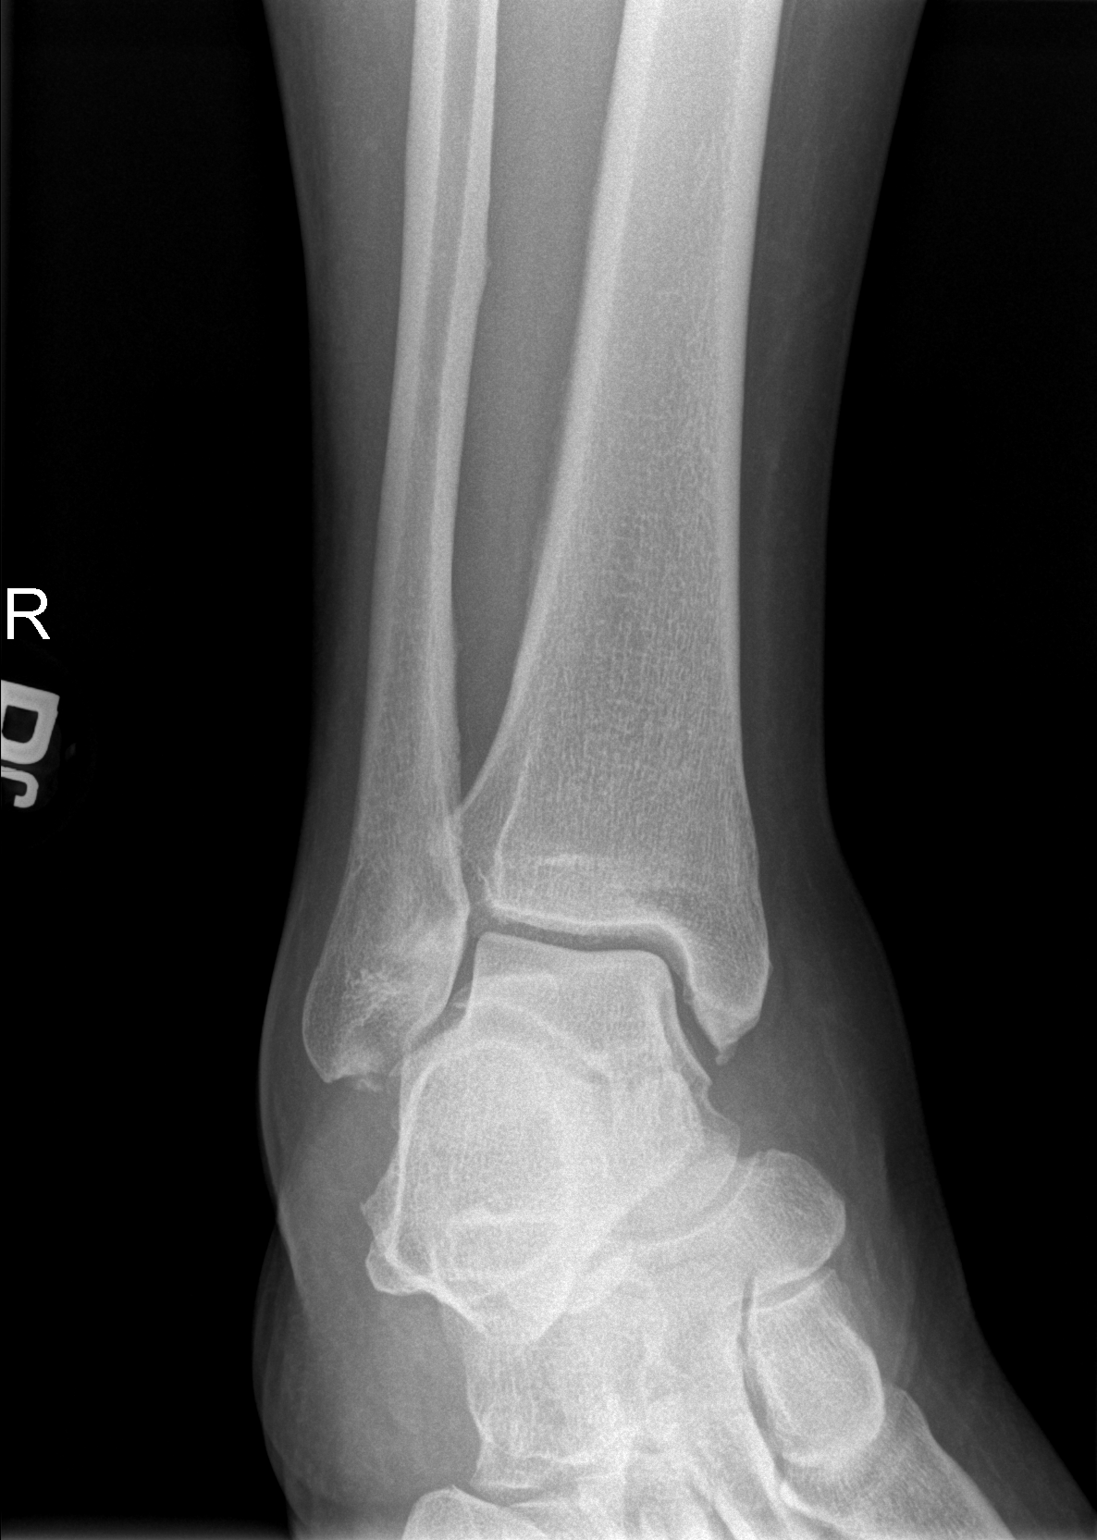

[t ankle joint lat right]
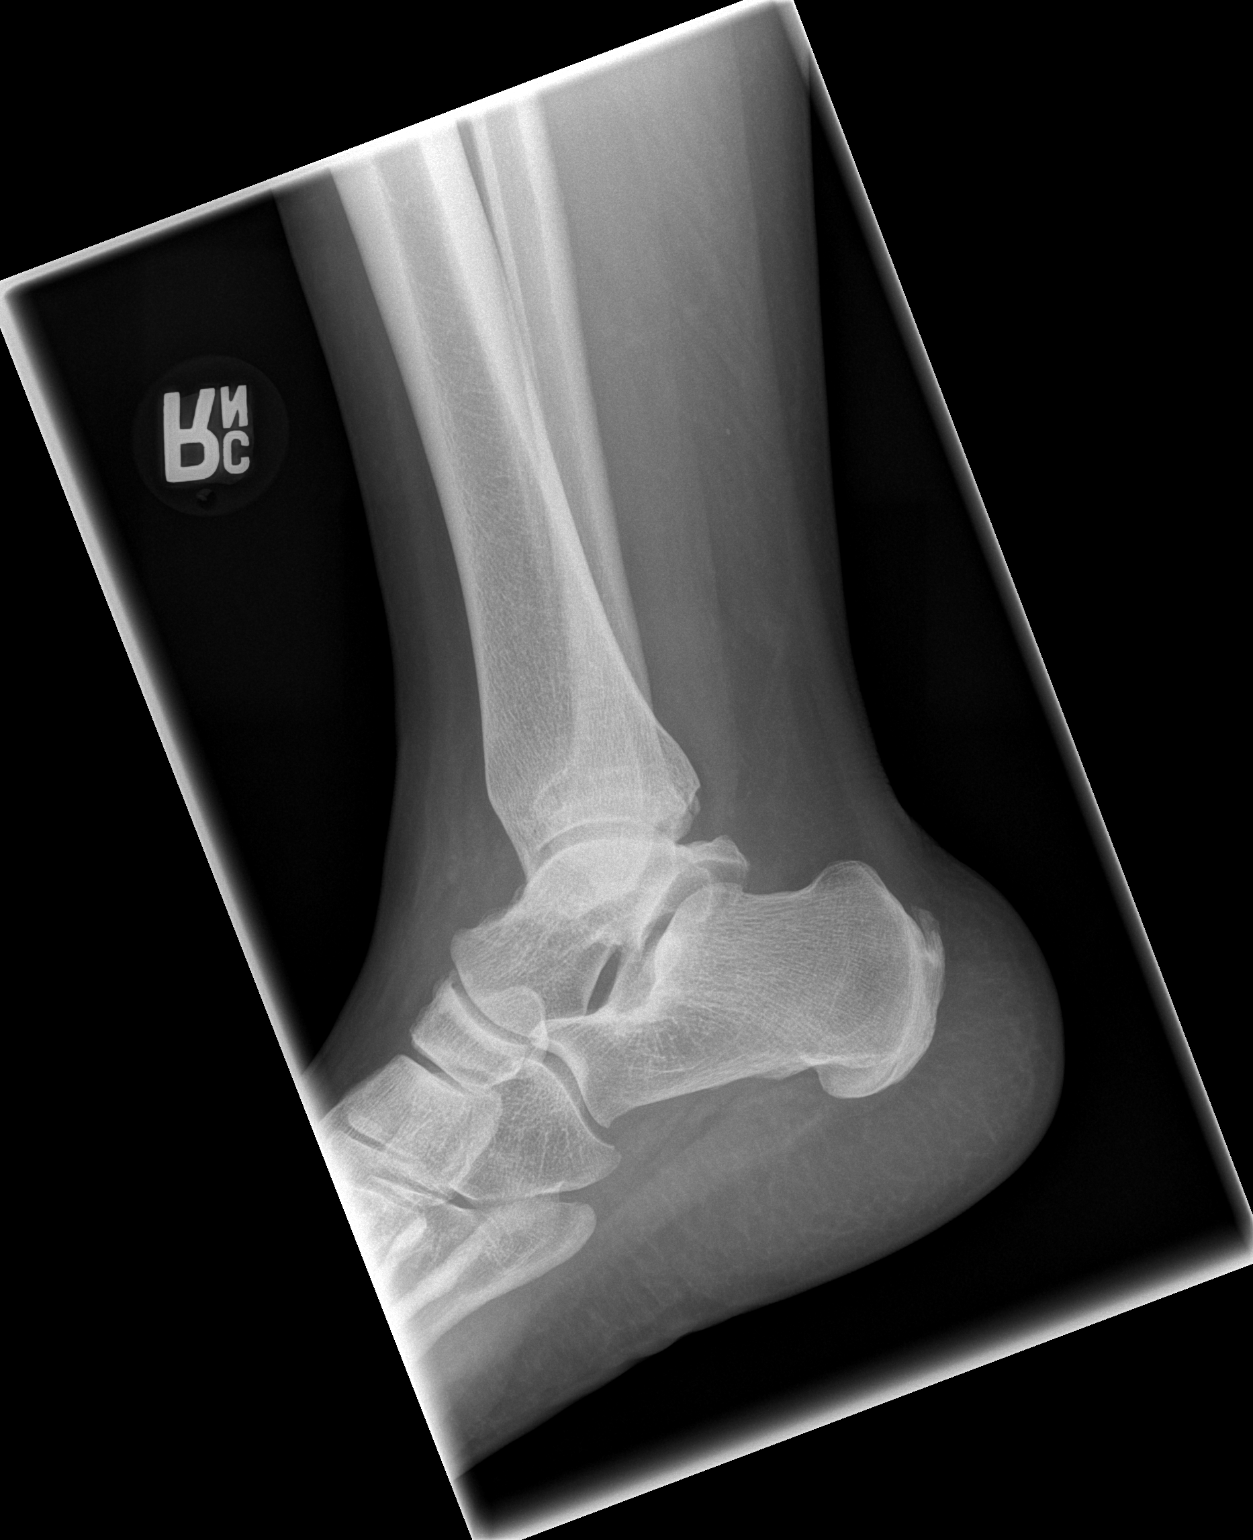

[3 of 3 positions shown; findings below may reference images not displayed]

FINDINGS: There is a small avulsion fracture from the tip of the lateral
malleolus with overlying soft tissue swelling. There is no other
fracture or dislocation. The ankle mortise is intact. There is mild
osteoarthritis of the talonavicular joint.
IMPRESSION: Small avulsion fracture from the tip of the lateral malleolus with
overlying soft tissue swelling.

## 2017-05-25 ENCOUNTER — Encounter (HOSPITAL_BASED_OUTPATIENT_CLINIC_OR_DEPARTMENT_OTHER): Payer: Self-pay | Admitting: Emergency Medicine

## 2017-05-25 ENCOUNTER — Emergency Department (HOSPITAL_BASED_OUTPATIENT_CLINIC_OR_DEPARTMENT_OTHER)
Admission: EM | Admit: 2017-05-25 | Discharge: 2017-05-25 | Disposition: A | Discharge status: Home / Self Care | Payer: BLUE CROSS/BLUE SHIELD | Attending: Emergency Medicine | Admitting: Emergency Medicine

## 2017-05-25 DIAGNOSIS — I10 Essential (primary) hypertension: Secondary | ICD-10-CM | POA: Insufficient documentation

## 2017-05-25 DIAGNOSIS — Y33XXXA Other specified events, undetermined intent, initial encounter: Secondary | ICD-10-CM | POA: Insufficient documentation

## 2017-05-25 DIAGNOSIS — I2510 Atherosclerotic heart disease of native coronary artery without angina pectoris: Secondary | ICD-10-CM | POA: Insufficient documentation

## 2017-05-25 DIAGNOSIS — H109 Unspecified conjunctivitis: Secondary | ICD-10-CM

## 2017-05-25 DIAGNOSIS — E7800 Pure hypercholesterolemia, unspecified: Secondary | ICD-10-CM | POA: Diagnosis not present

## 2017-05-25 DIAGNOSIS — Y998 Other external cause status: Secondary | ICD-10-CM | POA: Diagnosis not present

## 2017-05-25 DIAGNOSIS — Y939 Activity, unspecified: Secondary | ICD-10-CM | POA: Insufficient documentation

## 2017-05-25 DIAGNOSIS — H5789 Other specified disorders of eye and adnexa: Secondary | ICD-10-CM | POA: Diagnosis present

## 2017-05-25 DIAGNOSIS — Z79899 Other long term (current) drug therapy: Secondary | ICD-10-CM | POA: Diagnosis not present

## 2017-05-25 DIAGNOSIS — H1031 Unspecified acute conjunctivitis, right eye: Secondary | ICD-10-CM | POA: Diagnosis not present

## 2017-05-25 DIAGNOSIS — Y929 Unspecified place or not applicable: Secondary | ICD-10-CM | POA: Diagnosis not present

## 2017-05-25 DIAGNOSIS — S0501XA Injury of conjunctiva and corneal abrasion without foreign body, right eye, initial encounter: Secondary | ICD-10-CM | POA: Diagnosis not present

## 2017-05-25 MED ORDER — OFLOXACIN 0.3 % OP SOLN: 1.0000 [drp] | Freq: Four times a day (QID) | OPHTHALMIC | 0 refills | Status: DC

## 2017-05-25 MED ORDER — FLUORESCEIN SODIUM 1 MG OP STRP
1.0000 | ORAL_STRIP | Freq: Once | OPHTHALMIC | Status: AC
  Administered 2017-05-25: 1 via OPHTHALMIC

## 2017-05-25 MED ORDER — TETRACAINE HCL 0.5 % OP SOLN
OPHTHALMIC | Status: AC
  Filled 2017-05-25: qty 4

## 2017-05-25 MED ORDER — ACETAMINOPHEN 325 MG PO TABS
650.0000 mg | ORAL_TABLET | Freq: Once | ORAL | Status: AC
  Administered 2017-05-25: 650 mg via ORAL
  Filled 2017-05-25: qty 2

## 2017-05-25 NOTE — ED Notes (Signed)
ED Provider at bedside. 

## 2017-05-25 NOTE — ED Provider Notes (Signed)
[facility] EMERGENCY DEPARTMENT Provider Note   CSN: [id number] Arrival date & time: 05/25/17  1023     History   Chief Complaint Chief Complaint  Patient presents with  . Eye Drainage    HPI  Amanda Nielsen is a 46 y.o. Female with a history of HTN, CAD and thyroid disease, presents complaining of right eye redness and watery drainage that started this morning. Pt reports she felt like something was in her eye, but was unable to visualize any foreign body. Denies working with metal or any concern for metal in the eye. Pt reports she tried to flush her eye out with some visine drops, which caused a burning sensation. Pt reports she noticed some pain when she looks left, but is able to look in all directions. Pt reports no changes in vision, does not wear contact lenses. Pt denies any symptoms in left eye.     Past Medical History:  Diagnosis Date  . Chronic back pain   . Coronary artery disease   . High cholesterol   . Hypertension   . Morbid obesity (HCC)   . Thyroid disease     Patient Active Problem List   Diagnosis Date Noted  . Left foot pain 05/27/2015  . Right ankle injury 05/01/2015  . Essential (primary) hypertension 03/18/2015  . Bariatric surgery status 03/03/2015  . Morbid (severe) obesity due to excess calories (HCC) 03/03/2015  . Acid reflux 11/13/2014  . Arteriosclerosis of coronary artery 05/28/2014  . Dysmenorrhea 08/07/2013  . Irregular bleeding 08/07/2013  . Absolute anemia 04/17/2013  . History of partial thyroidectomy 04/17/2013  . History of cesarean section 04/17/2013  . H/O total hip arthroplasty 04/17/2013  . History of surgical procedure 04/17/2013  . HLD (hyperlipidemia) 11/06/2012  . Sciatica 01/14/2012    Past Surgical History:  Procedure Laterality Date  . CESAREAN SECTION    . CESAREAN SECTION    . CORONARY ANGIOPLASTY WITH STENT PLACEMENT    . GASTRIC BYPASS    . JOINT REPLACEMENT    . THYROID SURGERY    .  THYROID SURGERY    . TONSILLECTOMY    . TOTAL HIP ARTHROPLASTY    . TUBAL LIGATION      OB History    No data available       Home Medications    Prior to Admission medications   Medication Sig Start Date End Date Taking? Authorizing Provider  B Complex-C (B-COMPLEX WITH VITAMIN C) tablet Take 1 tablet by mouth daily.    [provider]  BIOTIN PO Take 1 tablet by mouth daily.    [provider]  calcium-vitamin D (OSCAL WITH D) 500-200 MG-UNIT tablet Take 1 tablet by mouth daily.    [provider]  colchicine 0.6 MG tablet Take 1 tablet (0.6 mg total) by mouth 2 (two) times daily. Patient taking differently: Take 0.6 mg by mouth 2 (two) times daily as needed (gout).  05/26/15   Hudnall, Azucena Fallen, MD  Cyanocobalamin (VITAMIN B-12 PO) Take 1 tablet by mouth daily.    [provider]  ferrous sulfate 325 (65 FE) MG tablet Take 65 mg by mouth daily with breakfast.    [provider]  HYDRALAZINE-HCTZ PO Take by mouth.    [provider]  HYDROcodone-acetaminophen (NORCO/VICODIN) 5-325 MG tablet Take 1 tablet by mouth every 4 (four) hours as needed. 11/03/16   Mabe, Latanya Maudlin, MD  lisinopril (PRINIVIL,ZESTRIL) 20 MG tablet Take 20 mg by mouth  daily.  03/05/15   [provider]  methocarbamol (ROBAXIN) 500 MG tablet Take 1 tablet (500 mg total) by mouth 2 (two) times daily. 11/03/16   Mabe, Latanya MaudlinMartha L, MD  metoprolol tartrate (LOPRESSOR) 25 MG tablet Take 25 mg by mouth 2 (two) times daily.      [provider]  Multiple Vitamin (MULTIVITAMIN WITH MINERALS) TABS tablet Take 1 tablet by mouth daily.    [provider]  ofloxacin (OCUFLOX) 0.3 % ophthalmic solution Place 1 drop into the right eye 4 (four) times daily. 05/25/17   Dartha LodgeFord, Jame Seelig N, PA-C  ondansetron (ZOFRAN ODT) 4 MG disintegrating tablet 4mg  ODT q4 hours prn nausea/vomit 08/15/16   Rolan BuccoBelfi, Melanie, MD    Family History History reviewed. No pertinent  family history.  Social History Social History  Substance Use Topics  . Smoking status: Never Smoker  . Smokeless tobacco: Never Used  . Alcohol use No     Allergies   Patient has no known allergies.   Review of Systems Review of Systems  Constitutional: Negative for chills and fever.  HENT: Negative for congestion, ear pain, rhinorrhea and sore throat.   Eyes: Positive for pain, discharge and redness. Negative for photophobia and visual disturbance.  Respiratory: Negative for cough.   Neurological: Negative for headaches.     Physical Exam Updated Vital Signs BP (!) 147/70   Pulse 61   Temp 98.6 F (37 C) (Oral)   Resp 18   Ht 5\' 6"  (1.676 m)   Wt 88.5 kg (195 lb)   SpO2 99%   BMI 31.47 kg/m   Physical Exam  Constitutional: She appears well-developed and well-nourished. No distress.  HENT:  Head: Normocephalic and atraumatic.  Eyes: Pupils are equal, round, and reactive to light. EOM are normal. Lids are everted and swept, no foreign bodies found. Right eye exhibits discharge (Watery). Right eye exhibits no hordeolum. No foreign body present in the right eye. Left eye exhibits no discharge. Right conjunctiva is injected. Right conjunctiva has no hemorrhage. Left conjunctiva is not injected. Right eye exhibits no nystagmus. Left eye exhibits no nystagmus.  No proptosis  Pulmonary/Chest: Effort normal. No respiratory distress.  Neurological: She is alert. Coordination normal.  Skin: She is not diaphoretic.  Psychiatric: She has a normal mood and affect. Her behavior is normal.  Nursing note and vitals reviewed.    ED Treatments / Results  Labs (all labs ordered are listed, but only abnormal results are displayed) Labs Reviewed - No data to display  EKG  EKG Interpretation None       Radiology No results found.  Procedures Procedures (including critical care time)  Medications Ordered in ED Medications  fluorescein ophthalmic strip 1 strip (1  strip Right Eye Given 05/25/17 1213)  acetaminophen (TYLENOL) tablet 650 mg (650 mg Oral Given 05/25/17 1333)     Initial Impression / Assessment and Plan / ED Course  I have reviewed the triage vital signs and the nursing notes.  Pertinent labs & imaging results that were available during my care of the patient were reviewed by me and considered in my medical decision making (see chart for details).  Pt with mild corneal abrasion on PE.  No evidence of FB on exam.  No change in vision.  Pt is not a contact lens wearer.  Exam non-concerning for orbital cellulitis, hyphema, corneal ulcers. Patient will be discharged home with ofloxacin drops. Patient understands to follow up with ophthalmology, & to return to ER  if new symptoms develop including change in vision, purulent drainage, or entrapment.    Final Clinical Impressions(s) / ED Diagnoses   Final diagnoses:  Abrasion of right cornea, initial encounter  Conjunctivitis of right eye, unspecified conjunctivitis type    New Prescriptions Discharge Medication List as of 05/25/2017  1:18 PM    START taking these medications   Details  ofloxacin (OCUFLOX) 0.3 % ophthalmic solution Place 1 drop into the right eye 4 (four) times daily., Starting Wed 05/25/2017, Print         Dartha Lodge, PA-C 05/26/17 1649    Melene Plan, DO 05/26/17 2019

## 2017-05-25 NOTE — Discharge Instructions (Signed)
Use antibiotic drops to prevent infection. Avoid rubbing or touching eyes. Tylenol as needed for pain. If symptoms are not improving after a few days please follow-up with your PCP or the ophthalmologist provided. If you develop worsening pain, decreased vision or other concerning symptoms please return for sooner follow-up.

## 2017-05-25 NOTE — ED Triage Notes (Signed)
Patient reports right eye pain and drainage which began this morning.  States she tried to put visine in it which burned her eye.  States that she also has pain when she looks to the left.

## 2017-11-27 ENCOUNTER — Other Ambulatory Visit: Payer: Self-pay

## 2017-11-27 ENCOUNTER — Encounter (HOSPITAL_BASED_OUTPATIENT_CLINIC_OR_DEPARTMENT_OTHER): Payer: Self-pay | Admitting: *Deleted

## 2017-11-27 DIAGNOSIS — Z79899 Other long term (current) drug therapy: Secondary | ICD-10-CM | POA: Insufficient documentation

## 2017-11-27 DIAGNOSIS — H5711 Ocular pain, right eye: Secondary | ICD-10-CM | POA: Insufficient documentation

## 2017-11-27 DIAGNOSIS — I10 Essential (primary) hypertension: Secondary | ICD-10-CM | POA: Diagnosis not present

## 2017-11-27 DIAGNOSIS — I2510 Atherosclerotic heart disease of native coronary artery without angina pectoris: Secondary | ICD-10-CM | POA: Diagnosis not present

## 2017-11-27 DIAGNOSIS — Z96649 Presence of unspecified artificial hip joint: Secondary | ICD-10-CM | POA: Insufficient documentation
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
## 2017-11-28 ENCOUNTER — Emergency Department (HOSPITAL_BASED_OUTPATIENT_CLINIC_OR_DEPARTMENT_OTHER)
Admission: EM | Admit: 2017-11-28 | Discharge: 2017-11-28 | Disposition: A | Discharge status: Home / Self Care | Payer: No Typology Code available for payment source | Attending: Emergency Medicine | Admitting: Emergency Medicine

## 2017-11-28 DIAGNOSIS — H5711 Ocular pain, right eye: Secondary | ICD-10-CM

## 2017-11-28 MED ORDER — PROPARACAINE HCL 0.5 % OP SOLN
1.0000 [drp] | Freq: Once | OPHTHALMIC | Status: AC
  Administered 2017-11-28: 1 [drp] via OPHTHALMIC
  Filled 2017-11-28: qty 15

## 2017-11-28 MED ORDER — FLUORESCEIN SODIUM 1 MG OP STRP
1.0000 | ORAL_STRIP | Freq: Once | OPHTHALMIC | Status: AC
  Administered 2017-11-28: 1 via OPHTHALMIC
  Filled 2017-11-28: qty 1

## 2017-11-28 NOTE — ED Notes (Signed)
ED Provider at bedside. 

## 2017-11-28 NOTE — Discharge Instructions (Addendum)
You were seen today for pain and blurry vision of the right eye.  There is no obvious residual foreign body.  You need follow-up very closely with your eye doctor for recheck.  Continue medications as prescribed.
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
## 2021-01-28 ENCOUNTER — Other Ambulatory Visit: Payer: Self-pay

## 2021-01-28 ENCOUNTER — Emergency Department (HOSPITAL_BASED_OUTPATIENT_CLINIC_OR_DEPARTMENT_OTHER): Payer: BLUE CROSS/BLUE SHIELD

## 2021-01-28 ENCOUNTER — Encounter (HOSPITAL_BASED_OUTPATIENT_CLINIC_OR_DEPARTMENT_OTHER): Payer: Self-pay | Admitting: Emergency Medicine

## 2021-01-28 ENCOUNTER — Emergency Department (HOSPITAL_BASED_OUTPATIENT_CLINIC_OR_DEPARTMENT_OTHER)
Admission: EM | Admit: 2021-01-28 | Discharge: 2021-01-29 | Disposition: A | Discharge status: Home / Self Care | Payer: BLUE CROSS/BLUE SHIELD | Attending: Emergency Medicine | Admitting: Emergency Medicine

## 2021-01-28 DIAGNOSIS — R112 Nausea with vomiting, unspecified: Secondary | ICD-10-CM | POA: Diagnosis not present

## 2021-01-28 DIAGNOSIS — R1013 Epigastric pain: Secondary | ICD-10-CM | POA: Insufficient documentation

## 2021-01-28 DIAGNOSIS — I10 Essential (primary) hypertension: Secondary | ICD-10-CM | POA: Insufficient documentation

## 2021-01-28 DIAGNOSIS — I2510 Atherosclerotic heart disease of native coronary artery without angina pectoris: Secondary | ICD-10-CM | POA: Insufficient documentation

## 2021-01-28 DIAGNOSIS — Z96649 Presence of unspecified artificial hip joint: Secondary | ICD-10-CM | POA: Insufficient documentation

## 2021-01-28 DIAGNOSIS — Z79899 Other long term (current) drug therapy: Secondary | ICD-10-CM | POA: Insufficient documentation

## 2021-01-28 DIAGNOSIS — Z951 Presence of aortocoronary bypass graft: Secondary | ICD-10-CM | POA: Diagnosis not present

## 2021-01-28 LAB — BASIC METABOLIC PANEL
Anion gap: 8 (ref 5–15)
BUN: 21 mg/dL — ABNORMAL HIGH (ref 6–20)
CO2: 26 mmol/L (ref 22–32)
Calcium: 9.1 mg/dL (ref 8.9–10.3)
Chloride: 104 mmol/L (ref 98–111)
Creatinine, Ser: 1.21 mg/dL — ABNORMAL HIGH (ref 0.44–1.00)
GFR, Estimated: 54 mL/min — ABNORMAL LOW (ref >60)
Glucose, Bld: 92 mg/dL (ref 70–99)
Potassium: 4.3 mmol/L (ref 3.5–5.1)
Sodium: 138 mmol/L (ref 135–145)

## 2021-01-28 LAB — CBC
HCT: 34.9 % — ABNORMAL LOW (ref 36.0–46.0)
Hemoglobin: 11.5 g/dL — ABNORMAL LOW (ref 12.0–15.0)
MCH: 30.4 pg (ref 26.0–34.0)
MCHC: 33 g/dL (ref 30.0–36.0)
MCV: 92.3 fL (ref 80.0–100.0)
Platelets: 284 10*3/uL (ref 150–400)
RBC: 3.78 MIL/uL — ABNORMAL LOW (ref 3.87–5.11)
RDW: 13.4 % (ref 11.5–15.5)
WBC: 6.3 10*3/uL (ref 4.0–10.5)
nRBC: 0 % (ref 0.0–0.2)

## 2021-01-28 LAB — HEPATIC FUNCTION PANEL
ALT: 25 U/L (ref 0–44)
AST: 24 U/L (ref 15–41)
Albumin: 4.1 g/dL (ref 3.5–5.0)
Alkaline Phosphatase: 64 U/L (ref 38–126)
Bilirubin, Direct: 0.1 mg/dL (ref 0.0–0.2)
Indirect Bilirubin: 0.7 mg/dL (ref 0.3–0.9)
Total Bilirubin: 0.8 mg/dL (ref 0.3–1.2)
Total Protein: 7.8 g/dL (ref 6.5–8.1)

## 2021-01-28 LAB — TROPONIN I (HIGH SENSITIVITY)
Troponin I (High Sensitivity): <2 ng/L (ref <18)
Troponin I (High Sensitivity): <2 ng/L (ref <18)

## 2021-01-28 LAB — PREGNANCY, URINE: Preg Test, Ur: NEGATIVE

## 2021-01-28 LAB — LIPASE, BLOOD: Lipase: 33 U/L (ref 11–51)

## 2021-01-28 MED ORDER — PANTOPRAZOLE SODIUM 40 MG IV SOLR
40.0000 mg | Freq: Once | INTRAVENOUS | Status: AC
  Administered 2021-01-28: 40 mg via INTRAVENOUS
  Filled 2021-01-28: qty 40

## 2021-01-28 MED ORDER — FENTANYL CITRATE (PF) 100 MCG/2ML IJ SOLN
50.0000 ug | Freq: Once | INTRAMUSCULAR | Status: AC
Start: 2021-01-28 — End: 2021-01-28
  Administered 2021-01-28: 50 ug via INTRAVENOUS
  Filled 2021-01-28: qty 2

## 2021-01-28 MED ORDER — ONDANSETRON HCL 4 MG/2ML IJ SOLN
4.0000 mg | Freq: Once | INTRAMUSCULAR | Status: AC
  Administered 2021-01-28: 4 mg via INTRAVENOUS
  Filled 2021-01-28: qty 2

## 2021-01-28 MED ORDER — SUCRALFATE 1 GM/10ML PO SUSP
1.0000 g | Freq: Once | ORAL | Status: AC
  Administered 2021-01-28: 1 g via ORAL
  Filled 2021-01-28: qty 10

## 2021-01-28 NOTE — ED Provider Notes (Signed)
[facility] Provider Note: Lowella Dell, MD, FACEP  CSN: [id number] MRN: [id number] ARRIVAL: 01/28/21 at 2015 ROOM: [facility]05/[facility]   CHIEF COMPLAINT  Abdominal Pain   HISTORY OF PRESENT ILLNESS  01/28/21 10:58 PM Amanda Nielsen is a 50 y.o. female 1 week of epigastric pain.  She describes the pain as crampy in nature.  It is always present but worse with eating or drinking.  She rates it as a 9 out of 10.  For the past 2 days it is radiated into her lower chest when it gets severe.  She has had occasional nausea and vomiting with this but no hematemesis.  She has had no diarrhea.  She has had no shortness of breath but with deep breathing exacerbates the pain.   Past Medical History:  Diagnosis Date   Chronic back pain    Coronary artery disease    High cholesterol    Hypertension    Morbid obesity (HCC)    Thyroid disease     Past Surgical History:  Procedure Laterality Date   CESAREAN SECTION     CESAREAN SECTION     CORONARY ANGIOPLASTY WITH STENT PLACEMENT     GASTRIC BYPASS     JOINT REPLACEMENT     THYROID SURGERY     THYROID SURGERY     TONSILLECTOMY     TOTAL HIP ARTHROPLASTY     TUBAL LIGATION      History reviewed. No pertinent family history.  Social History   Tobacco Use   Smoking status: Never   Smokeless tobacco: Never  Substance Use Topics   Alcohol use: No    Alcohol/week: 0.0 standard drinks   Drug use: No    Prior to Admission medications   Medication Sig Start Date End Date Taking? Authorizing Provider  ondansetron (ZOFRAN ODT) 8 MG disintegrating tablet Take 1 tablet (8 mg total) by mouth every 8 (eight) hours as needed for nausea or vomiting. 01/29/21  Yes Deziyah Arvin, MD  pantoprazole (PROTONIX) 20 MG tablet Take 1 tablet every morning at least 30 minutes before first dose of Carafate. 01/29/21  Yes Rosell Khouri, MD  sucralfate (CARAFATE) 1 g tablet Take 1 tablet (1 g total) by mouth 4 (four) times daily -  with meals and at  bedtime. 01/29/21  Yes Mikeal Winstanley, MD  B Complex-C (B-COMPLEX WITH VITAMIN C) tablet Take 1 tablet by mouth daily.    [provider]  BIOTIN PO Take 1 tablet by mouth daily.    [provider]  calcium-vitamin D (OSCAL WITH D) 500-200 MG-UNIT tablet Take 1 tablet by mouth daily.    [provider]  colchicine 0.6 MG tablet Take 1 tablet (0.6 mg total) by mouth 2 (two) times daily. Patient taking differently: Take 0.6 mg by mouth 2 (two) times daily as needed (gout).  05/26/15   Hudnall, Azucena Fallen, MD  Cyanocobalamin (VITAMIN B-12 PO) Take 1 tablet by mouth daily.    [provider]  ferrous sulfate 325 (65 FE) MG tablet Take 65 mg by mouth daily with breakfast.    [provider]  HYDRALAZINE-HCTZ PO Take by mouth.    [provider]  lisinopril (PRINIVIL,ZESTRIL) 20 MG tablet Take 20 mg by mouth daily.  03/05/15   [provider]  metoprolol tartrate (LOPRESSOR) 25 MG tablet Take 25 mg by mouth 2 (two) times daily.      [provider]  Multiple Vitamin (MULTIVITAMIN WITH MINERALS) TABS tablet Take 1  tablet by mouth daily.    [provider]    Allergies Hydromorphone, Oxycodone-acetaminophen, and Tramadol   REVIEW OF SYSTEMS  Negative except as noted here or in the History of Present Illness.   PHYSICAL EXAMINATION  Initial Vital Signs Blood pressure 116/81, pulse 71, temperature 99.3 F (37.4 C), temperature source Oral, resp. rate 16, height 5\' 6"  (1.676 m), weight 89.4 kg, SpO2 100 %.  Examination General: Well-developed, well-nourished female in no acute distress; appearance consistent with age of record HENT: normocephalic; atraumatic Eyes: pupils equal, round and reactive to light; extraocular muscles intact Neck: supple Heart: regular rate and rhythm Lungs: clear to auscultation bilaterally Abdomen: soft; nondistended; epigastric tenderness; bowel sounds present Extremities: No deformity; full  range of motion; pulses normal Neurologic: Awake, alert and oriented; motor function intact in all extremities and symmetric; no facial droop Skin: Warm and dry Psychiatric: Normal mood and affect   RESULTS  Summary of this visit's results, reviewed and interpreted by myself:   EKG Interpretation  Date/Time:  Wednesday January 28 2021 20:35:01 EDT Ventricular Rate:  89 PR Interval:  172 QRS Duration: 66 QT Interval:  346 QTC Calculation: 420 R Axis:   32 Text Interpretation: Normal sinus rhythm Anterior infarct , age undetermined Abnormal ECG No significant change since prior 1/18 Confirmed by 2/18 [phone]) on 01/28/2021 8:40:11 PM        Laboratory Studies: Results for orders placed or performed during the hospital encounter of 01/28/21 (from the past 24 hour(s))  Pregnancy, urine     Status: None   Collection Time: 01/28/21  8:38 PM  Result Value Ref Range   Preg Test, Ur NEGATIVE NEGATIVE  Basic metabolic panel     Status: Abnormal   Collection Time: 01/28/21  8:46 PM  Result Value Ref Range   Sodium 138 135 - 145 mmol/L   Potassium 4.3 3.5 - 5.1 mmol/L   Chloride 104 98 - 111 mmol/L   CO2 26 22 - 32 mmol/L   Glucose, Bld 92 70 - 99 mg/dL   BUN 21 (H) 6 - 20 mg/dL   Creatinine, Ser 03/31/21 (H) 0.44 - 1.00 mg/dL   Calcium 9.1 8.9 - 0.10 mg/dL   GFR, Estimated 54 (L) >60 mL/min   Anion gap 8 5 - 15  CBC     Status: Abnormal   Collection Time: 01/28/21  8:46 PM  Result Value Ref Range   WBC 6.3 4.0 - 10.5 K/uL   RBC 3.78 (L) 3.87 - 5.11 MIL/uL   Hemoglobin 11.5 (L) 12.0 - 15.0 g/dL   HCT 03/31/21 (L) 35.5 - 73.2 %   MCV 92.3 80.0 - 100.0 fL   MCH 30.4 26.0 - 34.0 pg   MCHC 33.0 30.0 - 36.0 g/dL   RDW 20.2 54.2 - 70.6 %   Platelets 284 150 - 400 K/uL   nRBC 0.0 0.0 - 0.2 %  Troponin I (High Sensitivity)     Status: None   Collection Time: 01/28/21  8:46 PM  Result Value Ref Range   Troponin I (High Sensitivity) <2 <18 ng/L  Hepatic function panel     Status:  None   Collection Time: 01/28/21  8:46 PM  Result Value Ref Range   Total Protein 7.8 6.5 - 8.1 g/dL   Albumin 4.1 3.5 - 5.0 g/dL   AST 24 15 - 41 U/L   ALT 25 0 - 44 U/L   Alkaline Phosphatase 64 38 - 126 U/L  Total Bilirubin 0.8 0.3 - 1.2 mg/dL   Bilirubin, Direct 0.1 0.0 - 0.2 mg/dL   Indirect Bilirubin 0.7 0.3 - 0.9 mg/dL  Lipase, blood     Status: None   Collection Time: 01/28/21  8:46 PM  Result Value Ref Range   Lipase 33 11 - 51 U/L  Troponin I (High Sensitivity)     Status: None   Collection Time: 01/28/21 10:53 PM  Result Value Ref Range   Troponin I (High Sensitivity) <2 <18 ng/L   Imaging Studies: DG Chest 2 View  Result Date: 01/28/2021 CLINICAL DATA:  Epigastric pain for 1 week and midsternal chest pain for 2 days. Nausea and vomiting for 2 days. EXAM: CHEST - 2 VIEW COMPARISON:  None. FINDINGS: Shallow inspiration. The heart size and mediastinal contours are within normal limits. Both lungs are clear. The visualized skeletal structures are unremarkable. IMPRESSION: No active cardiopulmonary disease. Electronically Signed   By: Burman Nieves M.D.   On: 01/28/2021 20:56    ED COURSE and MDM  Nursing notes, initial and subsequent vitals signs, including pulse oximetry, reviewed and interpreted by myself.  Vitals:   01/28/21 2228 01/28/21 2300 01/28/21 2327 01/28/21 2355  BP: 116/81 113/80 120/77 118/81  Pulse: 71 81 74 69  Resp: 16 12 13 14   Temp:  99.3 F (37.4 C)    TempSrc:  Oral    SpO2: 100% 96% 98% 99%  Weight:      Height:       Medications  pantoprazole (PROTONIX) injection 40 mg (40 mg Intravenous Given 01/28/21 2314)  ondansetron (ZOFRAN) injection 4 mg (4 mg Intravenous Given 01/28/21 2314)  fentaNYL (SUBLIMAZE) injection 50 mcg (50 mcg Intravenous Given 01/28/21 2314)  sucralfate (CARAFATE) 1 GM/10ML suspension 1 g (1 g Oral Given 01/28/21 2314)   12:58 AM Patient's symptoms improved with IV Protonix and oral Carafate.  She was advised of reassuring  lab studies.  I suspect her symptoms are due to gastritis or early peptic ulcer disease with an element of GERD causing the radiation into the lower chest.  We will start her on Protonix and Carafate.  Her presentation is not consistent with coronary etiology.   PROCEDURES  Procedures   ED DIAGNOSES     ICD-10-CM   1. Epigastric pain  R10.13          Sedric Guia, 03/31/21, MD 01/29/21 [phone]

## 2021-01-28 NOTE — ED Triage Notes (Signed)
Patient presents with complaints of epigastric pain onset 1 week ago and mid sternal chest pain onset 2 days ago with nausea vomiting onset 2 days ago; denies nausea at this time.

## 2021-01-29 MED ORDER — PANTOPRAZOLE SODIUM 20 MG PO TBEC: DELAYED_RELEASE_TABLET | ORAL | 0 refills | Status: AC

## 2021-01-29 MED ORDER — SUCRALFATE 1 G PO TABS: 1.0000 g | ORAL_TABLET | Freq: Three times a day (TID) | ORAL | 0 refills | Status: AC

## 2021-01-29 MED ORDER — ONDANSETRON 8 MG PO TBDP: 8.0000 mg | ORAL_TABLET | Freq: Three times a day (TID) | ORAL | 20 refills | Status: AC | PRN

## 2021-01-29 NOTE — ED Notes (Signed)
ED Provider at bedside. 

## 2021-10-12 ENCOUNTER — Encounter (HOSPITAL_BASED_OUTPATIENT_CLINIC_OR_DEPARTMENT_OTHER): Payer: Self-pay | Admitting: *Deleted

## 2021-10-12 ENCOUNTER — Emergency Department (HOSPITAL_BASED_OUTPATIENT_CLINIC_OR_DEPARTMENT_OTHER)
Admission: EM | Admit: 2021-10-12 | Discharge: 2021-10-12 | Disposition: A | Discharge status: Home / Self Care | Payer: 59 | Attending: Emergency Medicine | Admitting: Emergency Medicine

## 2021-10-12 ENCOUNTER — Emergency Department (HOSPITAL_BASED_OUTPATIENT_CLINIC_OR_DEPARTMENT_OTHER): Payer: 59

## 2021-10-12 ENCOUNTER — Other Ambulatory Visit (HOSPITAL_BASED_OUTPATIENT_CLINIC_OR_DEPARTMENT_OTHER): Payer: Self-pay

## 2021-10-12 ENCOUNTER — Other Ambulatory Visit: Payer: Self-pay

## 2021-10-12 DIAGNOSIS — Z79899 Other long term (current) drug therapy: Secondary | ICD-10-CM | POA: Insufficient documentation

## 2021-10-12 DIAGNOSIS — R519 Headache, unspecified: Secondary | ICD-10-CM | POA: Diagnosis present

## 2021-10-12 DIAGNOSIS — I2510 Atherosclerotic heart disease of native coronary artery without angina pectoris: Secondary | ICD-10-CM | POA: Insufficient documentation

## 2021-10-12 DIAGNOSIS — M25572 Pain in left ankle and joints of left foot: Secondary | ICD-10-CM | POA: Insufficient documentation

## 2021-10-12 DIAGNOSIS — I10 Essential (primary) hypertension: Secondary | ICD-10-CM | POA: Insufficient documentation

## 2021-10-12 DIAGNOSIS — R079 Chest pain, unspecified: Secondary | ICD-10-CM | POA: Diagnosis not present

## 2021-10-12 LAB — CBC
HCT: 34.4 % — ABNORMAL LOW (ref 36.0–46.0)
Hemoglobin: 11.6 g/dL — ABNORMAL LOW (ref 12.0–15.0)
MCH: 30.8 pg (ref 26.0–34.0)
MCHC: 33.7 g/dL (ref 30.0–36.0)
MCV: 91.2 fL (ref 80.0–100.0)
Platelets: 319 10*3/uL (ref 150–400)
RBC: 3.77 MIL/uL — ABNORMAL LOW (ref 3.87–5.11)
RDW: 13.1 % (ref 11.5–15.5)
WBC: 5.1 10*3/uL (ref 4.0–10.5)
nRBC: 0 % (ref 0.0–0.2)

## 2021-10-12 LAB — BASIC METABOLIC PANEL
Anion gap: 8 (ref 5–15)
BUN: 21 mg/dL — ABNORMAL HIGH (ref 6–20)
CO2: 26 mmol/L (ref 22–32)
Calcium: 9.4 mg/dL (ref 8.9–10.3)
Chloride: 106 mmol/L (ref 98–111)
Creatinine, Ser: 1.1 mg/dL — ABNORMAL HIGH (ref 0.44–1.00)
GFR, Estimated: >60 mL/min (ref >60)
Glucose, Bld: 87 mg/dL (ref 70–99)
Potassium: 4.2 mmol/L (ref 3.5–5.1)
Sodium: 140 mmol/L (ref 135–145)

## 2021-10-12 LAB — TROPONIN I (HIGH SENSITIVITY)
Troponin I (High Sensitivity): 3 ng/L (ref <18)
Troponin I (High Sensitivity): 3 ng/L (ref <18)

## 2021-10-12 MED ORDER — CYCLOBENZAPRINE HCL 10 MG PO TABS
10.0000 mg | ORAL_TABLET | Freq: Two times a day (BID) | ORAL | 0 refills | Status: AC | PRN
  Filled 2021-10-12: qty 20, 10d supply, fill #0

## 2021-10-12 MED ORDER — KETOROLAC TROMETHAMINE 30 MG/ML IJ SOLN
30.0000 mg | Freq: Once | INTRAMUSCULAR | Status: AC
  Administered 2021-10-12: 30 mg via INTRAVENOUS
  Filled 2021-10-12: qty 1

## 2021-10-12 MED ORDER — IOHEXOL 350 MG/ML SOLN
100.0000 mL | Freq: Once | INTRAVENOUS | Status: AC | PRN
  Administered 2021-10-12: 75 mL via INTRAVENOUS

## 2021-10-12 NOTE — ED Provider Notes (Signed)
[facility] EMERGENCY DEPARTMENT Provider Note   CSN: [id number] Arrival date & time: 10/12/21  1114     History  Chief Complaint  Patient presents with   Multiple Symptoms    Amanda Nielsen is a 51 y.o. female.  HPI      51yo female with history of CAD, htn, hlpd, thyroid disease, presents with concern for right sided back pain, chest pain, headaches and ankle pain.  2 weeks right scapula pain radiating to the chest, better when laying down, an aching pain Is worse with walking, sometimes deep breaths, coughing makes it worse No shortness of breath or nausea No abdominal pain  Headaches started 2 weeks ago, no head trauma Feels like pins and needles both sides No other numbness/weakenss/difficulty talking/change in vision Left ankle pain started yesterday when woke up, could barely walk on it, no trauma  No fevers, no cough,   No hx of dVT/pE   Feels different than last stent   Past Medical History:  Diagnosis Date   Chronic back pain    Coronary artery disease    High cholesterol    Hypertension    Morbid obesity (HCC)    Thyroid disease     Home Medications Prior to Admission medications   Medication Sig Start Date End Date Taking? Authorizing Provider  cyclobenzaprine (FLEXERIL) 10 MG tablet Take 1 tablet (10 mg total) by mouth 2 (two) times daily as needed for muscle spasms. 10/12/21  Yes Alvira Monday, MD  B Complex-C (B-COMPLEX WITH VITAMIN C) tablet Take 1 tablet by mouth daily.    [provider]  BIOTIN PO Take 1 tablet by mouth daily.    [provider]  calcium-vitamin D (OSCAL WITH D) 500-200 MG-UNIT tablet Take 1 tablet by mouth daily.    [provider]  colchicine 0.6 MG tablet Take 1 tablet (0.6 mg total) by mouth 2 (two) times daily. Patient taking differently: Take 0.6 mg by mouth 2 (two) times daily as needed (gout).  05/26/15   Hudnall, Azucena Fallen, MD  Cyanocobalamin (VITAMIN B-12 PO) Take 1 tablet  by mouth daily.    [provider]  ferrous sulfate 325 (65 FE) MG tablet Take 65 mg by mouth daily with breakfast.    [provider]  HYDRALAZINE-HCTZ PO Take by mouth.    [provider]  lisinopril (PRINIVIL,ZESTRIL) 20 MG tablet Take 20 mg by mouth daily.  03/05/15   [provider]  metoprolol tartrate (LOPRESSOR) 25 MG tablet Take 25 mg by mouth 2 (two) times daily.      [provider]  Multiple Vitamin (MULTIVITAMIN WITH MINERALS) TABS tablet Take 1 tablet by mouth daily.    [provider]  ondansetron (ZOFRAN ODT) 8 MG disintegrating tablet Take 1 tablet (8 mg total) by mouth every 8 (eight) hours as needed for nausea or vomiting. 01/29/21   Molpus, John, MD  pantoprazole (PROTONIX) 20 MG tablet Take 1 tablet every morning at least 30 minutes before first dose of Carafate. 01/29/21   Molpus, John, MD  sucralfate (CARAFATE) 1 g tablet Take 1 tablet (1 g total) by mouth 4 (four) times daily -  with meals and at bedtime. 01/29/21   Molpus, John, MD      Allergies    Hydromorphone, Oxycodone-acetaminophen, and Tramadol    Review of Systems   Review of Systems See above  Physical Exam Updated Vital Signs BP 124/62   Pulse 70   Temp 98.2 F (36.8  C)   Resp 20   Ht 5\' 6"  (1.676 m)   Wt 89.4 kg   SpO2 100%   BMI 31.81 kg/m  Physical Exam Vitals and nursing note reviewed.  Constitutional:      General: She is not in acute distress.    Appearance: Normal appearance. She is well-developed. She is not ill-appearing or diaphoretic.  HENT:     Head: Normocephalic and atraumatic.  Eyes:     General: No visual field deficit.    Extraocular Movements: Extraocular movements intact.     Conjunctiva/sclera: Conjunctivae normal.     Pupils: Pupils are equal, round, and reactive to light.  Cardiovascular:     Rate and Rhythm: Normal rate and regular rhythm.     Pulses: Normal pulses.     Heart sounds: Normal heart sounds. No murmur  heard.   No friction rub. No gallop.  Pulmonary:     Effort: Pulmonary effort is normal. No respiratory distress.     Breath sounds: Normal breath sounds. No wheezing or rales.  Abdominal:     General: There is no distension.     Palpations: Abdomen is soft.     Tenderness: There is no abdominal tenderness. There is no guarding.  Musculoskeletal:        General: No swelling or tenderness.     Cervical back: Normal range of motion.  Skin:    General: Skin is warm and dry.     Findings: No erythema or rash.  Neurological:     General: No focal deficit present.     Mental Status: She is alert and oriented to person, place, and time.     GCS: GCS eye subscore is 4. GCS verbal subscore is 5. GCS motor subscore is 6.     Cranial Nerves: No cranial nerve deficit, dysarthria or facial asymmetry.     Sensory: No sensory deficit.     Motor: No weakness or tremor.     Coordination: Coordination normal. Finger-Nose-Finger Test normal.    ED Results / Procedures / Treatments   Labs (all labs ordered are listed, but only abnormal results are displayed) Labs Reviewed  BASIC METABOLIC PANEL - Abnormal; Notable for the following components:      Result Value   BUN 21 (*)    Creatinine, Ser 1.10 (*)    All other components within normal limits  CBC - Abnormal; Notable for the following components:   RBC 3.77 (*)    Hemoglobin 11.6 (*)    HCT 34.4 (*)    All other components within normal limits  TROPONIN I (HIGH SENSITIVITY)  TROPONIN I (HIGH SENSITIVITY)    EKG EKG Interpretation  Date/Time:  Monday October 12 2021 11:29:42 EDT Ventricular Rate:  76 PR Interval:  220 QRS Duration: 70 QT Interval:  368 QTC Calculation: 414 R Axis:   17 Text Interpretation: Sinus rhythm with 1st degree A-V block Anterior infarct , age undetermined Abnormal ECG When compared with ECG of 28-Jan-2021 20:35, No significant change since last tracing Confirmed by Alvira Monday ([id number]) on 10/12/2021  11:50:50 AM  Radiology DG Chest 2 View  Result Date: 10/12/2021 CLINICAL DATA:  A 51 year old female presents for evaluation of pain in the RIGHT scapula. EXAM: CHEST - 2 VIEW COMPARISON:  January 28, 2021. FINDINGS: Trachea midline. Cardiomediastinal contours and hilar structures are normal. Lungs are clear. No pneumothorax. No pleural effusion. On limited assessment there is no acute skeletal finding. IMPRESSION: No acute cardiopulmonary disease. Electronically Signed  By: Donzetta Kohut M.D.   On: 10/12/2021 12:15   CT Head Wo Contrast  Result Date: 10/12/2021 CLINICAL DATA:  Headaches EXAM: CT HEAD WITHOUT CONTRAST TECHNIQUE: Contiguous axial images were obtained from the base of the skull through the vertex without intravenous contrast. RADIATION DOSE REDUCTION: This exam was performed according to the departmental dose-optimization program which includes automated exposure control, adjustment of the mA and/or kV according to patient size and/or use of iterative reconstruction technique. COMPARISON:  10/03/2013 FINDINGS: Brain: No acute intracranial findings are seen. Ventricles are not dilated. There is no shift of midline structures. Cortical sulci are prominent. Vascular: Unremarkable. Skull: Unremarkable. Sinuses/Orbits: Unremarkable. Other: No significant interval changes are noted IMPRESSION: No acute findings are seen in noncontrast CT brain.  Atrophy. Electronically Signed   By: Ernie Avena M.D.   On: 10/12/2021 14:18   CT Angio Chest Pulmonary Embolism (PE) W or WO Contrast  Result Date: 10/12/2021 CLINICAL DATA:  Chest and back pain EXAM: CT ANGIOGRAPHY CHEST WITH CONTRAST TECHNIQUE: Multidetector CT imaging of the chest was performed using the standard protocol during bolus administration of intravenous contrast. Multiplanar CT image reconstructions and MIPs were obtained to evaluate the vascular anatomy. RADIATION DOSE REDUCTION: This exam was performed according to the  departmental dose-optimization program which includes automated exposure control, adjustment of the mA and/or kV according to patient size and/or use of iterative reconstruction technique. CONTRAST:  75mL OMNIPAQUE IOHEXOL 350 MG/ML SOLN COMPARISON:  None. FINDINGS: Cardiovascular: Adequate contrast opacification of the pulmonary arteries. No evidence of pulmonary embolus. Normal heart size. No pericardial effusion. Stent versus calcifications of the LAD. No significant atherosclerotic disease of the thoracic aorta. Mediastinum/Nodes: Prior right thyroidectomy with heterogeneous enlarged left thyroid lobe. Esophagus is unremarkable. No pathologically enlarged lymph nodes seen in the chest. Lungs/Pleura: Central airways are patent. No consolidation, pleural effusion or pneumothorax. Upper Abdomen: Postsurgical changes of the stomach. No acute abnormality. Musculoskeletal: No chest wall abnormality. No acute or significant osseous findings. Review of the MIP images confirms the above findings. IMPRESSION: No evidence of pulmonary embolus. Electronically Signed   By: Allegra Lai M.D.   On: 10/12/2021 14:20   US Venous Img Lower Unilateral Left  Result Date: 10/12/2021 CLINICAL DATA:  One day of leg swelling concern for DVT. EXAM: LEFT LOWER EXTREMITY VENOUS DOPPLER ULTRASOUND TECHNIQUE: Gray-scale sonography with compression, as well as color and duplex ultrasound, were performed to evaluate the deep venous system(s) from the level of the common femoral vein through the popliteal and proximal calf veins. COMPARISON:  None. FINDINGS: VENOUS Normal compressibility of the common femoral, superficial femoral, and popliteal veins, as well as the visualized calf veins. Visualized portions of profunda femoral vein and great saphenous vein unremarkable. No filling defects to suggest DVT on grayscale or color Doppler imaging. Doppler waveforms show normal direction of venous flow, normal respiratory plasticity and  response to augmentation. Limited views of the contralateral common femoral vein are unremarkable. OTHER None. Limitations: none IMPRESSION: Negative. Electronically Signed   By: Maudry Mayhew M.D.   On: 10/12/2021 13:32    Procedures Procedures    Medications Ordered in ED Medications  iohexol (OMNIPAQUE) 350 MG/ML injection 100 mL (75 mLs Intravenous Contrast Given 10/12/21 1354)  ketorolac (TORADOL) 30 MG/ML injection 30 mg (30 mg Intravenous Given 10/12/21 1526)    ED Course/ Medical Decision Making/ A&P  51yo female with history of CAD, htn, hlpd, thyroid disease, presents with concern for right sided back pain, chest pain, headaches and ankle pain.  For ankle pain, differential diagnosis includes acute ischemia, DVT, gout, other arthritis.  No history of trauma to suggest fracture.  She has normal pulses and have low suspicion for acute arterial ischemia.  DVT study was completed and it did not show signs of DVT.  She has good range of motion, no fever, no erythema and have low suspicion for septic arthritis.  Possible other osteoarthritis or sprain.  Recommend PCP follow-up.  For headache-CT head was completed and evaluated by me and showed no evidence of acute intracranial hemorrhage, or other significant abnormalities.  Do not suspect occult subarachnoid hemorrhage.  At this time, have low suspicion for other CVT, meningitis, or CVA.  Regarding chest pain-An EKG was evaluated by me and showed normal sinus rhythm without acute ST changes consistent with ischemia or pericarditis.  Given concern for leg pain, pleuritic pain, CT PE study was no evidence of PE, and also no evidence of other acute intrathoracic findings including no pneumonia, pneumothorax, pulmonary effusion, pulmonary edema, no sign of significant aortic abnormalities.  She has normal pulses bilaterally, and given both imaging and history have low suspicion for aortic dissection. Troponin negative x2  and doubt ACS.  She does have tenderness on exam, suspect likely musculoskeletal etiology.  Given Toradol in the emergency department and prescription for muscle relaxant.  She does have hx of cardiac disease and encouraged follow up with her Cardiologist as well as PCP and gave return precautions. Patient discharged in stable condition with understanding of reasons to return.         Final Clinical Impression(s) / ED Diagnoses Final diagnoses:  Chest pain, unspecified type  Acute left ankle pain  Acute nonintractable headache, unspecified headache type    Rx / DC Orders ED Discharge Orders          Ordered    cyclobenzaprine (FLEXERIL) 10 MG tablet  2 times daily PRN        10/12/21 1518              Alvira Monday, MD 10/14/21 [phone]

## 2021-10-12 NOTE — ED Triage Notes (Signed)
Pain in her right scapula, left ankle this Headache x 3 days. ?

## 2022-10-07 ENCOUNTER — Emergency Department (HOSPITAL_BASED_OUTPATIENT_CLINIC_OR_DEPARTMENT_OTHER): Payer: Commercial Managed Care - HMO

## 2022-10-07 ENCOUNTER — Other Ambulatory Visit: Payer: Self-pay

## 2022-10-07 ENCOUNTER — Emergency Department (HOSPITAL_BASED_OUTPATIENT_CLINIC_OR_DEPARTMENT_OTHER)
Admission: EM | Admit: 2022-10-07 | Discharge: 2022-10-07 | Disposition: A | Discharge status: Home / Self Care | Payer: Commercial Managed Care - HMO | Attending: Emergency Medicine | Admitting: Emergency Medicine

## 2022-10-07 ENCOUNTER — Encounter (HOSPITAL_BASED_OUTPATIENT_CLINIC_OR_DEPARTMENT_OTHER): Payer: Self-pay

## 2022-10-07 DIAGNOSIS — I10 Essential (primary) hypertension: Secondary | ICD-10-CM | POA: Diagnosis not present

## 2022-10-07 DIAGNOSIS — Z79899 Other long term (current) drug therapy: Secondary | ICD-10-CM | POA: Diagnosis not present

## 2022-10-07 DIAGNOSIS — R1013 Epigastric pain: Secondary | ICD-10-CM | POA: Diagnosis not present

## 2022-10-07 DIAGNOSIS — R1010 Upper abdominal pain, unspecified: Secondary | ICD-10-CM | POA: Diagnosis present

## 2022-10-07 LAB — URINALYSIS, ROUTINE W REFLEX MICROSCOPIC
Bilirubin Urine: NEGATIVE
Glucose, UA: NEGATIVE mg/dL
Ketones, ur: NEGATIVE mg/dL
Leukocytes,Ua: NEGATIVE
Nitrite: NEGATIVE
Protein, ur: NEGATIVE mg/dL
Specific Gravity, Urine: 1.02 (ref 1.005–1.030)
pH: 5.5 (ref 5.0–8.0)

## 2022-10-07 LAB — URINALYSIS, MICROSCOPIC (REFLEX)

## 2022-10-07 LAB — CBC
HCT: 35.8 % — ABNORMAL LOW (ref 36.0–46.0)
Hemoglobin: 11.6 g/dL — ABNORMAL LOW (ref 12.0–15.0)
MCH: 28.7 pg (ref 26.0–34.0)
MCHC: 32.4 g/dL (ref 30.0–36.0)
MCV: 88.6 fL (ref 80.0–100.0)
Platelets: 294 10*3/uL (ref 150–400)
RBC: 4.04 MIL/uL (ref 3.87–5.11)
RDW: 13.5 % (ref 11.5–15.5)
WBC: 5 10*3/uL (ref 4.0–10.5)
nRBC: 0 % (ref 0.0–0.2)

## 2022-10-07 LAB — COMPREHENSIVE METABOLIC PANEL
ALT: 32 U/L (ref 0–44)
AST: 30 U/L (ref 15–41)
Albumin: 3.9 g/dL (ref 3.5–5.0)
Alkaline Phosphatase: 86 U/L (ref 38–126)
Anion gap: 7 (ref 5–15)
BUN: 21 mg/dL — ABNORMAL HIGH (ref 6–20)
CO2: 28 mmol/L (ref 22–32)
Calcium: 9.1 mg/dL (ref 8.9–10.3)
Chloride: 106 mmol/L (ref 98–111)
Creatinine, Ser: 1.07 mg/dL — ABNORMAL HIGH (ref 0.44–1.00)
GFR, Estimated: >60 mL/min (ref >60)
Glucose, Bld: 93 mg/dL (ref 70–99)
Potassium: 4.1 mmol/L (ref 3.5–5.1)
Sodium: 141 mmol/L (ref 135–145)
Total Bilirubin: 1.1 mg/dL (ref 0.3–1.2)
Total Protein: 7.5 g/dL (ref 6.5–8.1)

## 2022-10-07 LAB — LIPASE, BLOOD: Lipase: 35 U/L (ref 11–51)

## 2022-10-07 LAB — TROPONIN I (HIGH SENSITIVITY): Troponin I (High Sensitivity): 2 ng/L (ref <18)

## 2022-10-07 LAB — PREGNANCY, URINE: Preg Test, Ur: NEGATIVE

## 2022-10-07 MED ORDER — SUCRALFATE 1 GM/10ML PO SUSP
1.0000 g | Freq: Once | ORAL | Status: AC
  Administered 2022-10-07: 1 g via ORAL
  Filled 2022-10-07: qty 10

## 2022-10-07 MED ORDER — ONDANSETRON HCL 4 MG/2ML IJ SOLN
4.0000 mg | Freq: Once | INTRAMUSCULAR | Status: AC
  Administered 2022-10-07: 4 mg via INTRAVENOUS
  Filled 2022-10-07: qty 2

## 2022-10-07 MED ORDER — MORPHINE SULFATE (PF) 4 MG/ML IV SOLN
4.0000 mg | Freq: Once | INTRAVENOUS | Status: AC
  Administered 2022-10-07: 4 mg via INTRAVENOUS
  Filled 2022-10-07: qty 1

## 2022-10-07 MED ORDER — ALUM & MAG HYDROXIDE-SIMETH 200-200-20 MG/5ML PO SUSP
30.0000 mL | Freq: Once | ORAL | Status: AC
Start: 2022-10-07 — End: 2022-10-07
  Administered 2022-10-07: 30 mL via ORAL
  Filled 2022-10-07: qty 30

## 2022-10-07 MED ORDER — IOHEXOL 300 MG/ML  SOLN
100.0000 mL | Freq: Once | INTRAMUSCULAR | Status: AC | PRN
Start: 2022-10-07 — End: 2022-10-07
  Administered 2022-10-07: 100 mL via INTRAVENOUS

## 2022-10-07 NOTE — ED Triage Notes (Signed)
Pt presents with complaint of sharp upper abdominal pain that radiates back x one week Pain has been intermittent. Reports only nausea

## 2022-10-07 NOTE — ED Provider Notes (Signed)
EMERGENCY DEPARTMENT AT [facility] Provider Note   CSN: [id number] Arrival date & time: 10/07/22  0940     History  Chief Complaint  Patient presents with   Abdominal Pain    Amanda Nielsen is a 52 y.o. female with a past medical history significant for hypertension and hyperlipidemia who presents to the ED due to upper abdominal pain x 1 week that radiates to mid back associated with nausea.  No relation to eating.  Patient has a history of gastric bypass in 2016.  No urinary symptoms.  Denies vomiting and diarrhea.  Denies chest pain and shortness of breath.  No history of blood clots, recent surgeries, recent long immobilizations, or lower extremity edema.  Denies melena and hematochezia.  Denies alcohol use and chronic NSAIDs.  History obtained from patient and past medical records. No interpreter used during encounter.       Home Medications Prior to Admission medications   Medication Sig Start Date End Date Taking? Authorizing Provider  B Complex-C (B-COMPLEX WITH VITAMIN C) tablet Take 1 tablet by mouth daily.    [provider]  BIOTIN PO Take 1 tablet by mouth daily.    [provider]  calcium-vitamin D (OSCAL WITH D) 500-200 MG-UNIT tablet Take 1 tablet by mouth daily.    [provider]  colchicine 0.6 MG tablet Take 1 tablet (0.6 mg total) by mouth 2 (two) times daily. Patient taking differently: Take 0.6 mg by mouth 2 (two) times daily as needed (gout).  05/26/15   Hudnall, Sharyn Lull, MD  Cyanocobalamin (VITAMIN B-12 PO) Take 1 tablet by mouth daily.    [provider]  cyclobenzaprine (FLEXERIL) 10 MG tablet Take 1 tablet (10 mg total) by mouth 2 (two) times daily as needed for muscle spasms. 10/12/21   Gareth Morgan, MD  ferrous sulfate 325 (65 FE) MG tablet Take 65 mg by mouth daily with breakfast.    [provider]  HYDRALAZINE-HCTZ PO Take by mouth.    [provider]  lisinopril  (PRINIVIL,ZESTRIL) 20 MG tablet Take 20 mg by mouth daily.  03/05/15   [provider]  metoprolol tartrate (LOPRESSOR) 25 MG tablet Take 25 mg by mouth 2 (two) times daily.      [provider]  Multiple Vitamin (MULTIVITAMIN WITH MINERALS) TABS tablet Take 1 tablet by mouth daily.    [provider]  ondansetron (ZOFRAN ODT) 8 MG disintegrating tablet Take 1 tablet (8 mg total) by mouth every 8 (eight) hours as needed for nausea or vomiting. 01/29/21   Molpus, John, MD  pantoprazole (PROTONIX) 20 MG tablet Take 1 tablet every morning at least 30 minutes before first dose of Carafate. 01/29/21   Molpus, John, MD  sucralfate (CARAFATE) 1 g tablet Take 1 tablet (1 g total) by mouth 4 (four) times daily -  with meals and at bedtime. 01/29/21   Molpus, John, MD      Allergies    Hydromorphone, Oxycodone-acetaminophen, and Tramadol    Review of Systems   Review of Systems  Constitutional:  Negative for chills and fever.  Respiratory:  Negative for shortness of breath.   Cardiovascular:  Negative for chest pain.  Gastrointestinal:  Positive for abdominal pain and nausea. Negative for diarrhea and vomiting.    Physical Exam Updated Vital Signs BP 121/82   Pulse 71   Temp 97.9 F (36.6 C) (Oral)   Resp 18   Ht '5\' 5"'$  (1.651 m)  Wt 102.1 kg   SpO2 95%   BMI 37.44 kg/m  Physical Exam Vitals and nursing note reviewed.  Constitutional:      General: She is not in acute distress.    Appearance: She is not ill-appearing.  HENT:     Head: Normocephalic.  Eyes:     Pupils: Pupils are equal, round, and reactive to light.  Cardiovascular:     Rate and Rhythm: Normal rate and regular rhythm.     Pulses: Normal pulses.     Heart sounds: Normal heart sounds. No murmur heard.    No friction rub. No gallop.  Pulmonary:     Effort: Pulmonary effort is normal.     Breath sounds: Normal breath sounds.  Abdominal:     General: Abdomen is flat. There is no distension.      Palpations: Abdomen is soft.     Tenderness: There is abdominal tenderness. There is no guarding or rebound.     Comments: Epigastric tenderness  Musculoskeletal:        General: Normal range of motion.     Cervical back: Neck supple.  Skin:    General: Skin is warm and dry.  Neurological:     General: No focal deficit present.     Mental Status: She is alert.  Psychiatric:        Mood and Affect: Mood normal.        Behavior: Behavior normal.     ED Results / Procedures / Treatments   Labs (all labs ordered are listed, but only abnormal results are displayed) Labs Reviewed  COMPREHENSIVE METABOLIC PANEL - Abnormal; Notable for the following components:      Result Value   BUN 21 (*)    Creatinine, Ser 1.07 (*)    All other components within normal limits  CBC - Abnormal; Notable for the following components:   Hemoglobin 11.6 (*)    HCT 35.8 (*)    All other components within normal limits  URINALYSIS, ROUTINE W REFLEX MICROSCOPIC - Abnormal; Notable for the following components:   Hgb urine dipstick SMALL (*)    All other components within normal limits  URINALYSIS, MICROSCOPIC (REFLEX) - Abnormal; Notable for the following components:   Bacteria, UA RARE (*)    All other components within normal limits  LIPASE, BLOOD  PREGNANCY, URINE  TROPONIN I (HIGH SENSITIVITY)    EKG EKG Interpretation  Date/Time:  Thursday October 07 2022 10:12:06 EDT Ventricular Rate:  87 PR Interval:  183 QRS Duration: 77 QT Interval:  361 QTC Calculation: 435 R Axis:   16 Text Interpretation: Sinus rhythm Confirmed by Cindee Lame [phone]) on 10/07/2022 1:35:16 PM  Radiology DG Chest Portable 1 View  Result Date: 10/07/2022 CLINICAL DATA:  Chest pain, 1 week of abdominal pain radiating through to back EXAM: PORTABLE CHEST 1 VIEW COMPARISON:  Portable exam 1416 hours compared to 10/12/2021 FINDINGS: Normal heart size, mediastinal contours, and pulmonary vascularity. Lungs clear. No  acute infiltrate, pleural effusion, or pneumothorax. Osseous structures unremarkable. IMPRESSION: No acute abnormalities. Electronically Signed   By: Lavonia Dana M.D.   On: 10/07/2022 14:30   CT ABDOMEN PELVIS W CONTRAST  Result Date: 10/07/2022 CLINICAL DATA:  Epigastric pain, back pain EXAM: CT ABDOMEN AND PELVIS WITH CONTRAST TECHNIQUE: Multidetector CT imaging of the abdomen and pelvis was performed using the standard protocol following bolus administration of intravenous contrast. RADIATION DOSE REDUCTION: This exam was performed according to the departmental dose-optimization program which includes automated  exposure control, adjustment of the mA and/or kV according to patient size and/or use of iterative reconstruction technique. CONTRAST:  155m OMNIPAQUE IOHEXOL 300 MG/ML  SOLN COMPARISON:  10/12/2015 FINDINGS: Lower chest: Visualized lower lung fields are clear. Hepatobiliary: Gallbladder is distended. There is no wall thickening. There is slight prominence of intrahepatic bile ducts. Pancreas: No focal abnormalities are seen. Spleen: Unremarkable. Adrenals/Urinary Tract: Adrenals are unremarkable. There is no hydronephrosis. There is interval removal or passage of large calculus at left ureteropelvic junction since 10/12/2015. In the current study, there are no opaque renal or ureteral stones. There are multiple smooth marginated low-density lesions measuring up to 4.5 cm in size in both kidneys suggesting bilateral renal cysts. Urinary bladder is partly obscured by beam hardening artifacts. Stomach/Bowel: Stomach is not distended. Surgical staples are seen in the stomach and jejunum suggesting possible previous bariatric surgery. Small bowel loops are not dilated. Appendix is not dilated. There is no significant wall thickening in colon. There is no pericolic stranding. Evaluation of rectosigmoid is limited by beam hardening artifacts. Vascular/Lymphatic: Scattered arterial calcifications are seen.  There is possible coronary artery stent. Reproductive: IUD is seen. Evaluation is limited by beam hardening artifacts. Other: There is no ascites or pneumoperitoneum. Small umbilical hernia containing fat is seen. Left inguinal hernia containing fat is seen. Musculoskeletal: There is first-degree spondylolisthesis at L4-L5 level. Spinal stenosis and encroachment of neural foramina is seen at multiple levels in lumbar spine. There is previous left hip arthroplasty. IMPRESSION: There is no evidence of intestinal obstruction or pneumoperitoneum. Appendix is not dilated. There is no hydronephrosis. Renal cysts.  IUD is noted in place.  Lumbar spondylosis. Other findings as described in the body of the report. Electronically Signed   By: PElmer PickerM.D.   On: 10/07/2022 12:37    Procedures Procedures    Medications Ordered in ED Medications  morphine (PF) 4 MG/ML injection 4 mg (4 mg Intravenous Given 10/07/22 1136)  ondansetron (ZOFRAN) injection 4 mg (4 mg Intravenous Given 10/07/22 1135)  iohexol (OMNIPAQUE) 300 MG/ML solution 100 mL (100 mLs Intravenous Contrast Given 10/07/22 1158)  alum & mag hydroxide-simeth (MAALOX/MYLANTA) 200-200-20 MG/5ML suspension 30 mL (30 mLs Oral Given 10/07/22 1412)  sucralfate (CARAFATE) 1 GM/10ML suspension 1 g (1 g Oral Given 10/07/22 1412)    ED Course/ Medical Decision Making/ A&P Clinical Course as of 10/07/22 1500  Thu Oct 07, 2022  1348 Discussed with MJana Halfwith general surgery who reviewed CT images. No findings concerning, specifically related to gallbladder or bariatric surgery. Recommends PCP follow-up. [[id number] patient at bedside.  Patient admits to continued pain which improved after morphine.  Abdomen soft, nondistended with tenderness throughout epigastric and right upper quadrant.  Unknown etiology of pain given CT findings.  Carafate and GI cocktail given. [CA]    Clinical Course User Index [CA] ASuzy Bouchard PA-C                              Medical Decision Making Amount and/or Complexity of Data Reviewed Labs: ordered. Decision-making details documented in ED Course. Radiology: ordered and independent interpretation performed. Decision-making details documented in ED Course.  Risk OTC drugs. Prescription drug management.   This patient presents to the ED for concern of upper abdominal pain, this involves an extensive number of treatment options, and is a complaint that carries with it a high risk of complications and morbidity.  The differential diagnosis  includes pancreatitis, gastritis, acute cholecystitis, bowel obstruction, etc  52 year old female with history of hypertension and hyperlipidemia who presents to the ED due to epigastric pain associated with nausea x 1 week.  No chest pain or shortness of breath.  Upon arrival, patient afebrile, not tachycardic or hypoxic.  No alcohol use or chronic NSAIDs no previous.  History of gastric bypass.  Patient in no acute distress.  Tenderness throughout epigastrium and right upper quadrant.  Abdominal labs ordered.  Lipase rule out pancreatitis.  CT abdomen given history of gastric bypass.  IV morphine and Zofran given.  CBC reassuring.  No leukocytosis.  Normal hemoglobin.  UA significant for hematuria.  No evidence of infection.  Doubt acute cystitis or pyelonephritis.  CMP significant for mild elevation creatinine 1.07.  Normal LFTs.  No major electrolyte derangements.  Lipase normal.  Doubt pancreatitis.  Troponin normal.  EKG demonstrates normal sinus rhythm.  No signs of acute ischemia.  Doubt atypical ACS.  Chest x-ray personally reviewed and interpreted which is negative signs of pneumonia, pneumothorax or widened mediastinum.  CT abdomen negative for any acute abnormalities.  Does demonstrate a distended gallbladder without stones.  Discussed with general surgery given distended gallbladder and history of bariatric surgery.  See note above.  Unknown etiology  of abdominal pain.  Low suspicion for aortic dissection.  Low suspicion for PE.  Upon Reassessment, patient had some improvement after GI cocktail.  Possible gastritis? GI referral given to patient at discharge. Strict ED precautions discussed with patient. Patient states understanding and agrees to plan. Patient discharged home in no acute distress and stable vitals  Has PCP       Final Clinical Impression(s) / ED Diagnoses Final diagnoses:  Epigastric pain    Rx / DC Orders ED Discharge Orders     None         Suzy Bouchard, PA-C 10/07/22 1502    Audley Hose, MD 10/11/22 [phone]

## 2022-10-07 NOTE — Discharge Instructions (Signed)
It was a pleasure taking care of you today.  As discussed, all of your labs were reassuring.  Your CT abdomen showed a distended gallbladder however, no stones.  The surgeon was able to review your images which they felt was reassuring.  Continue to take ibuprofen or Tylenol as needed for pain.  Follow-up with PCP if symptoms do not improve over the next few days.  I have included the number of the GI doctor.  Please call to schedule an appointment if symptoms not improve over the next week.  Return to the ER for any worsening symptoms.

## 2022-10-07 NOTE — ED Notes (Signed)
Discharge paperwork reviewed entirely with patient, including Rx's and follow up care. Pain was under control. Pt verbalized understanding as well as all parties involved. No questions or concerns voiced at the time of discharge. No acute distress noted.   Pt ambulated out to PVA without incident or assistance.  

## 2022-12-14 ENCOUNTER — Other Ambulatory Visit: Payer: Self-pay

## 2022-12-14 DIAGNOSIS — Z1231 Encounter for screening mammogram for malignant neoplasm of breast: Secondary | ICD-10-CM

## 2023-01-05 ENCOUNTER — Emergency Department (HOSPITAL_BASED_OUTPATIENT_CLINIC_OR_DEPARTMENT_OTHER)
Admission: EM | Admit: 2023-01-05 | Discharge: 2023-01-05 | Disposition: A | Discharge status: Home / Self Care | Payer: Commercial Managed Care - HMO | Attending: Emergency Medicine | Admitting: Emergency Medicine

## 2023-01-05 ENCOUNTER — Telehealth (HOSPITAL_BASED_OUTPATIENT_CLINIC_OR_DEPARTMENT_OTHER): Payer: Self-pay

## 2023-01-05 ENCOUNTER — Emergency Department (HOSPITAL_BASED_OUTPATIENT_CLINIC_OR_DEPARTMENT_OTHER): Payer: Commercial Managed Care - HMO

## 2023-01-05 ENCOUNTER — Other Ambulatory Visit: Payer: Self-pay

## 2023-01-05 ENCOUNTER — Encounter (HOSPITAL_BASED_OUTPATIENT_CLINIC_OR_DEPARTMENT_OTHER): Payer: Self-pay

## 2023-01-05 DIAGNOSIS — Z20822 Contact with and (suspected) exposure to covid-19: Secondary | ICD-10-CM | POA: Diagnosis not present

## 2023-01-05 DIAGNOSIS — R519 Headache, unspecified: Secondary | ICD-10-CM | POA: Diagnosis not present

## 2023-01-05 DIAGNOSIS — R072 Precordial pain: Secondary | ICD-10-CM | POA: Insufficient documentation

## 2023-01-05 DIAGNOSIS — R0789 Other chest pain: Secondary | ICD-10-CM | POA: Diagnosis present

## 2023-01-05 DIAGNOSIS — R051 Acute cough: Secondary | ICD-10-CM | POA: Insufficient documentation

## 2023-01-05 LAB — CBC
HCT: 35.4 % — ABNORMAL LOW (ref 36.0–46.0)
Hemoglobin: 11.6 g/dL — ABNORMAL LOW (ref 12.0–15.0)
MCH: 29.5 pg (ref 26.0–34.0)
MCHC: 32.8 g/dL (ref 30.0–36.0)
MCV: 90.1 fL (ref 80.0–100.0)
Platelets: 281 10*3/uL (ref 150–400)
RBC: 3.93 MIL/uL (ref 3.87–5.11)
RDW: 13.8 % (ref 11.5–15.5)
WBC: 5.3 10*3/uL (ref 4.0–10.5)
nRBC: 0 % (ref 0.0–0.2)

## 2023-01-05 LAB — RESP PANEL BY RT-PCR (RSV, FLU A&B, COVID)  RVPGX2
Influenza A by PCR: NEGATIVE
Influenza B by PCR: NEGATIVE
Resp Syncytial Virus by PCR: NEGATIVE
SARS Coronavirus 2 by RT PCR: NEGATIVE

## 2023-01-05 LAB — TROPONIN I (HIGH SENSITIVITY)
Troponin I (High Sensitivity): 2 ng/L (ref <18)
Troponin I (High Sensitivity): 2 ng/L (ref <18)

## 2023-01-05 LAB — BASIC METABOLIC PANEL
Anion gap: 9 (ref 5–15)
BUN: 14 mg/dL (ref 6–20)
CO2: 29 mmol/L (ref 22–32)
Calcium: 9 mg/dL (ref 8.9–10.3)
Chloride: 103 mmol/L (ref 98–111)
Creatinine, Ser: 1.27 mg/dL — ABNORMAL HIGH (ref 0.44–1.00)
GFR, Estimated: 51 mL/min — ABNORMAL LOW (ref >60)
Glucose, Bld: 89 mg/dL (ref 70–99)
Potassium: 3.4 mmol/L — ABNORMAL LOW (ref 3.5–5.1)
Sodium: 141 mmol/L (ref 135–145)

## 2023-01-05 LAB — PREGNANCY, URINE: Preg Test, Ur: NEGATIVE

## 2023-01-05 LAB — D-DIMER, QUANTITATIVE: D-Dimer, Quant: 0.44 ug/mL-FEU (ref 0.00–0.50)

## 2023-01-05 MED ORDER — SODIUM CHLORIDE 0.9 % IV BOLUS
1000.0000 mL | Freq: Once | INTRAVENOUS | Status: AC
  Administered 2023-01-05: 1000 mL via INTRAVENOUS

## 2023-01-05 MED ORDER — PROCHLORPERAZINE EDISYLATE 10 MG/2ML IJ SOLN
10.0000 mg | Freq: Once | INTRAMUSCULAR | Status: AC
  Administered 2023-01-05: 10 mg via INTRAVENOUS
  Filled 2023-01-05: qty 2

## 2023-01-05 MED ORDER — NAPROXEN 500 MG PO TABS: 500.0000 mg | ORAL_TABLET | Freq: Two times a day (BID) | ORAL | 0 refills | Status: AC

## 2023-01-05 MED ORDER — DIPHENHYDRAMINE HCL 50 MG/ML IJ SOLN
12.5000 mg | Freq: Once | INTRAMUSCULAR | Status: AC
  Administered 2023-01-05: 12.5 mg via INTRAVENOUS
  Filled 2023-01-05: qty 1

## 2023-01-05 MED ORDER — KETOROLAC TROMETHAMINE 30 MG/ML IJ SOLN
15.0000 mg | Freq: Once | INTRAMUSCULAR | Status: AC
  Administered 2023-01-05: 15 mg via INTRAVENOUS
  Filled 2023-01-05: qty 1

## 2023-01-05 NOTE — ED Triage Notes (Signed)
Pt reports chest pressure associated with a dry cough x 1 week. She reports it feels like someone is squeezing her rib cage, and the pain radiates to her back. She reports it feels like Bronchitis which she has had before.

## 2023-01-05 NOTE — ED Provider Notes (Signed)
[place] EMERGENCY DEPARTMENT AT [facility] Provider Note   CSN: [id number] Arrival date & time: 01/05/23  1602     History  Chief Complaint  Patient presents with   Chest Pain    Amanda Nielsen is a 52 y.o. female here for evaluation of feeling unwell.  1 week of cough, congestion, chest pressure as well as headache.  Pain worse with deep breathing.  Cough nonproductive.  Feels like when she had bronchitis previously.  She has felt warm however does not take her temperature.  Some mild rhinorrhea and congestion.  Headache diffuse in nature.  No sudden onset thunderclap headache.  No diplopia.  No unilateral weakness, numbness.  No back pain, abdominal pain, diarrhea, dysuria.  No difficulty speaking, gait instability.  No meds PTA.  HPI     Home Medications Prior to Admission medications   Medication Sig Start Date End Date Taking? Authorizing Provider  naproxen (NAPROSYN) 500 MG tablet Take 1 tablet (500 mg total) by mouth 2 (two) times daily. 01/05/23  Yes Rielynn Trulson A, PA-C  B Complex-C (B-COMPLEX WITH VITAMIN C) tablet Take 1 tablet by mouth daily.    [provider]  BIOTIN PO Take 1 tablet by mouth daily.    [provider]  calcium-vitamin D (OSCAL WITH D) 500-200 MG-UNIT tablet Take 1 tablet by mouth daily.    [provider]  colchicine 0.6 MG tablet Take 1 tablet (0.6 mg total) by mouth 2 (two) times daily. Patient taking differently: Take 0.6 mg by mouth 2 (two) times daily as needed (gout).  05/26/15   Hudnall, Azucena Fallen, MD  Cyanocobalamin (VITAMIN B-12 PO) Take 1 tablet by mouth daily.    [provider]  cyclobenzaprine (FLEXERIL) 10 MG tablet Take 1 tablet (10 mg total) by mouth 2 (two) times daily as needed for muscle spasms. 10/12/21   Alvira Monday, MD  ferrous sulfate 325 (65 FE) MG tablet Take 65 mg by mouth daily with breakfast.    [provider]  HYDRALAZINE-HCTZ PO Take by mouth.     [provider]  lisinopril (PRINIVIL,ZESTRIL) 20 MG tablet Take 20 mg by mouth daily.  03/05/15   [provider]  metoprolol tartrate (LOPRESSOR) 25 MG tablet Take 25 mg by mouth 2 (two) times daily.      [provider]  Multiple Vitamin (MULTIVITAMIN WITH MINERALS) TABS tablet Take 1 tablet by mouth daily.    [provider]  ondansetron (ZOFRAN ODT) 8 MG disintegrating tablet Take 1 tablet (8 mg total) by mouth every 8 (eight) hours as needed for nausea or vomiting. 01/29/21   Molpus, John, MD  pantoprazole (PROTONIX) 20 MG tablet Take 1 tablet every morning at least 30 minutes before first dose of Carafate. 01/29/21   Molpus, John, MD  sucralfate (CARAFATE) 1 g tablet Take 1 tablet (1 g total) by mouth 4 (four) times daily -  with meals and at bedtime. 01/29/21   Molpus, John, MD      Allergies    Hydromorphone, Oxycodone-acetaminophen, and Tramadol    Review of Systems   Review of Systems  Constitutional:  Positive for chills and fatigue.  HENT:  Positive for congestion and rhinorrhea. Negative for sore throat, trouble swallowing and voice change.   Respiratory:  Positive for cough.   Cardiovascular:  Positive for chest pain. Negative for palpitations and leg swelling.  Gastrointestinal: Negative.   Genitourinary: Negative.   Musculoskeletal: Negative.   Skin: Negative.  Neurological:  Positive for headaches. Negative for dizziness, tremors, seizures, syncope, facial asymmetry, speech difficulty, weakness, light-headedness and numbness.  All other systems reviewed and are negative.   Physical Exam Updated Vital Signs BP (!) 142/80 (BP Location: Left Arm)   Pulse 62   Temp 98.3 F (36.8 C) (Oral)   Resp 14   Ht 5\' 6"  (1.676 m)   Wt 102.1 kg   SpO2 98%   BMI 36.32 kg/m  Physical Exam Vitals and nursing note reviewed.  Constitutional:      General: She is not in acute distress.    Appearance: She is well-developed. She is not ill-appearing,  toxic-appearing or diaphoretic.  HENT:     Head: Normocephalic and atraumatic.  Eyes:     Pupils: Pupils are equal, round, and reactive to light.  Cardiovascular:     Rate and Rhythm: Normal rate.     Pulses:          Radial pulses are 2+ on the right side and 2+ on the left side.       Dorsalis pedis pulses are 2+ on the right side and 2+ on the left side.     Heart sounds: Normal heart sounds.  Pulmonary:     Effort: Pulmonary effort is normal. No respiratory distress.     Breath sounds: Normal breath sounds.     Comments: Clear bilaterally, speaks in full sentences without difficulty. Abdominal:     General: Bowel sounds are normal. There is no distension.     Palpations: Abdomen is soft.     Comments: Soft, nontender  Musculoskeletal:        General: Normal range of motion.     Cervical back: Normal range of motion.     Right lower leg: No tenderness. No edema.     Left lower leg: No tenderness. No edema.     Comments: No bony tenderness, full range of motion, compartment soft  Skin:    General: Skin is warm and dry.     Capillary Refill: Capillary refill takes less than 2 seconds.  Neurological:     General: No focal deficit present.     Mental Status: She is alert.     Comments: Cranial nerves II through XII grossly intact Equal strength Intact sensation Ambulatory that ataxic gait Negative finger-nose, heel-to-shin  Psychiatric:        Mood and Affect: Mood normal.     ED Results / Procedures / Treatments   Labs (all labs ordered are listed, but only abnormal results are displayed) Labs Reviewed  BASIC METABOLIC PANEL - Abnormal; Notable for the following components:      Result Value   Potassium 3.4 (*)    Creatinine, Ser 1.27 (*)    GFR, Estimated 51 (*)    All other components within normal limits  CBC - Abnormal; Notable for the following components:   Hemoglobin 11.6 (*)    HCT 35.4 (*)    All other components within normal limits  RESP PANEL BY  RT-PCR (RSV, FLU A&B, COVID)  RVPGX2  PREGNANCY, URINE  D-DIMER, QUANTITATIVE  TROPONIN I (HIGH SENSITIVITY)  TROPONIN I (HIGH SENSITIVITY)    EKG EKG Interpretation  Date/Time:  Wednesday January 05 2023 16:12:06 EDT Ventricular Rate:  69 PR Interval:  196 QRS Duration: 77 QT Interval:  417 QTC Calculation: 447 R Axis:   26 Text Interpretation: Sinus rhythm Low voltage, precordial leads No significant change since last tracing Confirmed by Elayne Snare (  [id number]) on 01/05/2023 4:36:53 PM  Radiology DG Chest 2 View  Result Date: 01/05/2023 CLINICAL DATA:  Chest pain EXAM: CHEST - 2 VIEW COMPARISON:  One-view x-ray 10/07/2022 and older FINDINGS: No consolidation, pneumothorax or effusion. No edema. Normal cardiopericardial silhouette. Degenerative changes are seen along the spine. IMPRESSION: No acute cardiopulmonary disease. Electronically Signed   By: Karen Kays M.D.   On: 01/05/2023 16:45    Procedures Procedures    Medications Ordered in ED Medications  prochlorperazine (COMPAZINE) injection 10 mg (10 mg Intravenous Given 01/05/23 1903)  diphenhydrAMINE (BENADRYL) injection 12.5 mg (12.5 mg Intravenous Given 01/05/23 1903)  sodium chloride 0.9 % bolus 1,000 mL (1,000 mLs Intravenous New Bag/Given 01/05/23 1902)  ketorolac (TORADOL) 30 MG/ML injection 15 mg (15 mg Intravenous Given 01/05/23 2026)    ED Course/ Medical Decision Making/ A&P   52 year old here for evaluation of cough, chest pain, headache.  Began 1 week ago.  No sudden onset thunderclap headache.  Pain does not radiate.  No numbness or weakness.  No diplopia.  No difficulty with word finding.  No pain or swelling to lower legs, no PND orthopnea.  No congestion, rhinorrhea, sore throat.  No sick contacts.  No fever.  Tolerating p.o. intake at home.  Feels like when she had bronchitis previously.  Heart and lungs are clear.  Abdomen soft, tender.  She is nonfocal neuroexam without deficits.  No clinical evidence of VTE  on exam.  Does not appear grossly fluid overloaded.  Will plan on labs, imaging and reassess.  Labs and imaging personally viewed and interpreted:  CBC without leukocytosis, hemoglobin 11.69 metabolic panel creatinine 1.27, potassium 3.4 COVID, flu, RSV negative D-dimer 0.44 Pregnancy test negative Delta troponin flat Chest xray without pneumonia, edema, cardiomegaly, pneumothorax, EKG without ischemic changes  Patient reassessed.  Symptoms improved.  He continues have a nonfocal neuroexam without deficit.  Symptoms do not seem consistent with ACS, PE, dissection, pneumothorax, unstable angina.  With regards to her headache low suspicion for CVA, dissection, meningitis, bleed.  Question viral illness as cause of her symptoms.  Will have her follow-up outpatient, return for new or worsening symptoms.  No evidence of bacterial infectious process requiring antibiotics at this time.  The patient has been appropriately medically screened and/or stabilized in the ED. I have low suspicion for any other emergent medical condition which would require further screening, evaluation or treatment in the ED or require inpatient management.  Patient is hemodynamically stable and in no acute distress.  Patient able to ambulate in department prior to ED.  Evaluation does not show acute pathology that would require ongoing or additional emergent interventions while in the emergency department or further inpatient treatment.  I have discussed the diagnosis with the patient and answered all questions.  Pain is been managed while in the emergency department and patient has no further complaints prior to discharge.  Patient is comfortable with plan discussed in room and is stable for discharge at this time.  I have discussed strict return precautions for returning to the emergency department.  Patient was encouraged to follow-up with PCP/specialist refer to at discharge.                            Medical Decision  Making Amount and/or Complexity of Data Reviewed External Data Reviewed: labs, radiology, ECG and notes. Labs: ordered. Decision-making details documented in ED Course. Radiology: ordered and independent interpretation performed. Decision-making details documented in  ED Course. ECG/medicine tests: ordered and independent interpretation performed. Decision-making details documented in ED Course.  Risk OTC drugs. Prescription drug management. Parenteral controlled substances. Decision regarding hospitalization. Diagnosis or treatment significantly limited by social determinants of health.          Final Clinical Impression(s) / ED Diagnoses Final diagnoses:  Precordial pain  Acute cough  Acute nonintractable headache, unspecified headache type    Rx / DC Orders ED Discharge Orders          Ordered    naproxen (NAPROSYN) 500 MG tablet  2 times daily        01/05/23 2130              Averil Digman A, PA-C 01/05/23 2134    Rexford Maus, DO 01/05/23 2323

## 2023-01-05 NOTE — Discharge Instructions (Signed)
It was a pleasure taking care of you here in the emergency department.  We have written you for a medication to help with your symptoms.  Make sure to follow-up with your primary care provider, return for new or worsening symptoms.

## 2023-08-14 ENCOUNTER — Emergency Department (HOSPITAL_BASED_OUTPATIENT_CLINIC_OR_DEPARTMENT_OTHER)
Admission: EM | Admit: 2023-08-14 | Discharge: 2023-08-14 | Disposition: A | Discharge status: Home / Self Care | Payer: 59 | Attending: Emergency Medicine | Admitting: Emergency Medicine

## 2023-08-14 ENCOUNTER — Other Ambulatory Visit: Payer: Self-pay

## 2023-08-14 ENCOUNTER — Encounter (HOSPITAL_BASED_OUTPATIENT_CLINIC_OR_DEPARTMENT_OTHER): Payer: Self-pay | Admitting: Emergency Medicine

## 2023-08-14 ENCOUNTER — Emergency Department (HOSPITAL_BASED_OUTPATIENT_CLINIC_OR_DEPARTMENT_OTHER): Payer: 59

## 2023-08-14 DIAGNOSIS — M79672 Pain in left foot: Secondary | ICD-10-CM

## 2023-08-14 DIAGNOSIS — M7742 Metatarsalgia, left foot: Secondary | ICD-10-CM | POA: Insufficient documentation

## 2023-08-14 MED ORDER — PREDNISONE 10 MG (21) PO TBPK: ORAL_TABLET | Freq: Every day | ORAL | 0 refills | Status: AC

## 2023-08-14 NOTE — Discharge Instructions (Addendum)
Please follow-up with your primary care doctor, return to the ER if you feel like your symptoms are worsening.  Make sure you are buying orthotic shoe inserts, to help support your foot, and try to be off bearing as much as possible.  You can take Tylenol and ibuprofen for the pain control.  Follow-up with your PCP

## 2023-08-14 NOTE — ED Provider Notes (Cosign Needed Addendum)
[place] [facility] Provider Note   CSN: [id number] Arrival date & time: 08/14/23  1437     History  Chief Complaint  Patient presents with   Foot Pain    left    Amanda Nielsen is a 53 y.o. female, no pertinent past medical history, presents to the ED secondary to left foot pain, this been going on for the last couple days.  She states that the area feels swollen, and tender to the touch, and is difficult to walk, because the pain.  She does have a history of arthritis, but no history of gout.  She denies any kind of redness, or wounds to the area.  Denies any trauma.    Home Medications Prior to Admission medications   Medication Sig Start Date End Date Taking? Authorizing Provider  predniSONE (STERAPRED UNI-PAK 21 TAB) 10 MG (21) TBPK tablet Take by mouth daily. Take 4 tabs by mouth daily  for 2 days, then 3 tabs for 2 days, then 2 tabs for 2 days, then 1 tabs for 2 days 08/14/23  Yes Lisette Mancebo L, PA  B Complex-C (B-COMPLEX WITH VITAMIN C) tablet Take 1 tablet by mouth daily.    [provider]  BIOTIN PO Take 1 tablet by mouth daily.    [provider]  calcium-vitamin D (OSCAL WITH D) 500-200 MG-UNIT tablet Take 1 tablet by mouth daily.    [provider]  colchicine 0.6 MG tablet Take 1 tablet (0.6 mg total) by mouth 2 (two) times daily. Patient taking differently: Take 0.6 mg by mouth 2 (two) times daily as needed (gout).  05/26/15   Hudnall, Azucena Fallen, MD  Cyanocobalamin (VITAMIN B-12 PO) Take 1 tablet by mouth daily.    [provider]  cyclobenzaprine (FLEXERIL) 10 MG tablet Take 1 tablet (10 mg total) by mouth 2 (two) times daily as needed for muscle spasms. 10/12/21   Alvira Monday, MD  ferrous sulfate 325 (65 FE) MG tablet Take 65 mg by mouth daily with breakfast.    [provider]  HYDRALAZINE-HCTZ PO Take by mouth.    [provider]  lisinopril (PRINIVIL,ZESTRIL) 20 MG  tablet Take 20 mg by mouth daily.  03/05/15   [provider]  metoprolol tartrate (LOPRESSOR) 25 MG tablet Take 25 mg by mouth 2 (two) times daily.      [provider]  Multiple Vitamin (MULTIVITAMIN WITH MINERALS) TABS tablet Take 1 tablet by mouth daily.    [provider]  naproxen (NAPROSYN) 500 MG tablet Take 1 tablet (500 mg total) by mouth 2 (two) times daily. 01/05/23   Henderly, Britni A, PA-C  ondansetron (ZOFRAN ODT) 8 MG disintegrating tablet Take 1 tablet (8 mg total) by mouth every 8 (eight) hours as needed for nausea or vomiting. 01/29/21   Molpus, John, MD  pantoprazole (PROTONIX) 20 MG tablet Take 1 tablet every morning at least 30 minutes before first dose of Carafate. 01/29/21   Molpus, John, MD  sucralfate (CARAFATE) 1 g tablet Take 1 tablet (1 g total) by mouth 4 (four) times daily -  with meals and at bedtime. 01/29/21   Molpus, John, MD      Allergies    Hydromorphone, Oxycodone-acetaminophen, and Tramadol    Review of Systems   Review of Systems  Constitutional:  Negative for fever.  Musculoskeletal:  Positive for arthralgias.    Physical Exam Updated Vital Signs BP (!) 147/86 (BP Location: Left Arm)  Pulse 64   Temp 97.7 F (36.5 C)   Resp 18   Wt 99.8 kg   SpO2 98%   BMI 35.51 kg/m  Physical Exam Vitals and nursing note reviewed.  Constitutional:      General: She is not in acute distress.    Appearance: She is well-developed.  HENT:     Head: Normocephalic and atraumatic.  Eyes:     General:        Right eye: No discharge.        Left eye: No discharge.     Conjunctiva/sclera: Conjunctivae normal.  Pulmonary:     Effort: No respiratory distress.  Musculoskeletal:     Comments: Tenderness to palpation of MCP joints, second and third left toes with mild swelling.  No erythema, edema or warmth.  No wounds noted.  Range of motion intact.+dorsalis pedal pulse  Neurological:     Mental Status: She is alert.     Comments: Clear  speech.   Psychiatric:        Behavior: Behavior normal.        Thought Content: Thought content normal.     ED Results / Procedures / Treatments   Labs (all labs ordered are listed, but only abnormal results are displayed) Labs Reviewed - No data to display  EKG None  Radiology DG Foot Complete Left Result Date: 08/14/2023 CLINICAL DATA:  Left foot pain.  No injury. EXAM: LEFT FOOT - COMPLETE 3+ VIEW COMPARISON:  06/25/2015. FINDINGS: No fracture or bone lesion. Joints are normally spaced and aligned. Minimal plantar and dorsal calcaneal spurs. Nonspecific soft tissue swelling diffusely. IMPRESSION: 1. No fracture or acute finding. 2. Nonspecific soft tissue swelling. Electronically Signed   By: Amie Portland M.D.   On: 08/14/2023 15:46    Procedures Procedures    Medications Ordered in ED Medications - No data to display  ED Course/ Medical Decision Making/ A&P                                 Medical Decision Making Patient is a 53 year old female, here for left foot pain, has been going on for the last couple days.  Mild swelling, to the second and third MCPs.  With tenderness to palpation.  This may be secondary to poor footwear, versus a stress fracture, versus gout.  We will obtain x-ray for further evaluation  Amount and/or Complexity of Data Reviewed Radiology:     Details: X-ray unremarkable except for mild swelling Discussion of management or test interpretation with external provider(s): Discussed with patient, there is mild swelling, this may be from inflammation of the area secondary to poor footwear choices.  Versus gout.  Will put her on some prednisone, and have her follow-up with the PCP.  I instructed her to use orthotics, to help with the pain control, and off bear as much as possible.  Return precautions emphasized.  No evidence of wounds.  Not diabetic  Risk Prescription drug management.    Final Clinical Impression(s) / ED Diagnoses Final diagnoses:   Foot pain, left  Metatarsalgia of left foot    Rx / DC Orders ED Discharge Orders          Ordered    predniSONE (STERAPRED UNI-PAK 21 TAB) 10 MG (21) TBPK tablet  Daily        08/14/23 1624  Pete Pelt, PA 08/14/23 1628    Pete Pelt, [place] 08/14/23 1629    Terrilee Files, MD 08/15/23 1201

## 2023-08-14 NOTE — ED Triage Notes (Signed)
Left foot pain under the base of her toes , no injury or fall . Hx arthritis , no Hx gout .  Reports swelling to the area .

## 2023-08-31 IMAGING — CT CT ANGIO CHEST
2 of 8 series · 19 of 36 positions shown · IV contrast (Omnipaque)
Comparison: None.

CLINICAL DATA: Chest and back pain

EXAM:
CT ANGIOGRAPHY CHEST WITH CONTRAST
TECHNIQUE: Multidetector CT imaging of the chest was performed using the
standard protocol during bolus administration of intravenous
contrast. Multiplanar CT image reconstructions and MIPs were
obtained to evaluate the vascular anatomy.

[Series 6: pe thins · axial · 0.64mm/px · z∈[-271,-44]mm · 18 of 255 slices shown]
[im 14/255  lung]
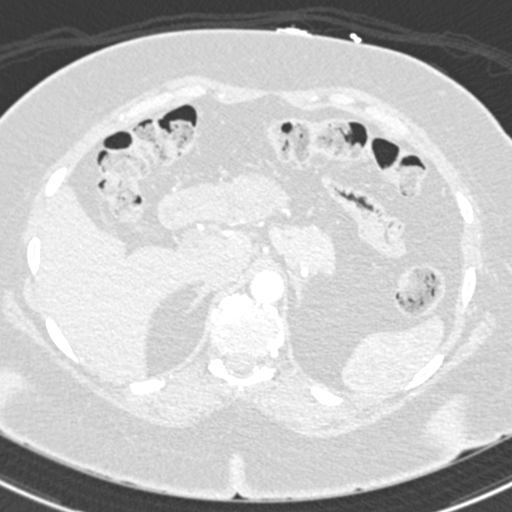
[im 27/255  mediastinal]
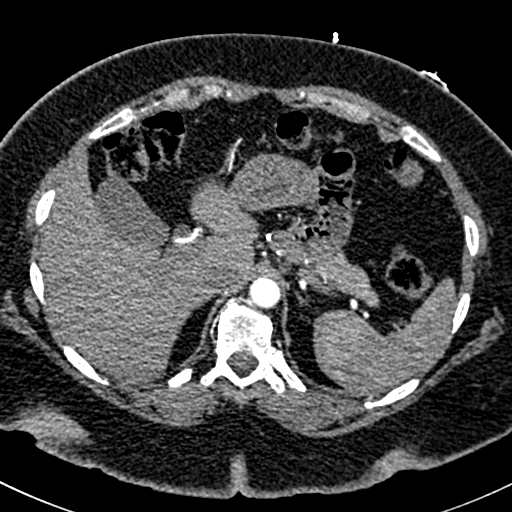
[im 41/255  lung]
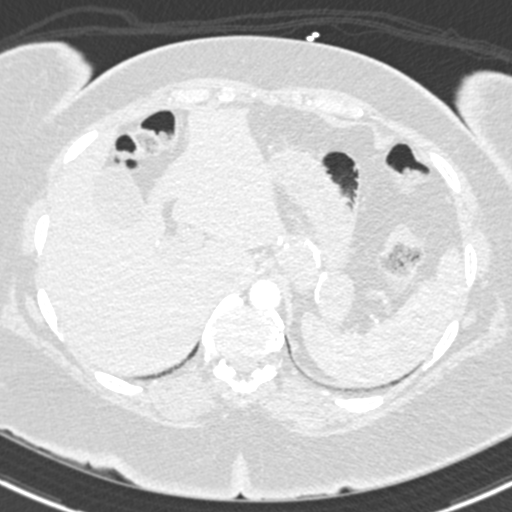
[im 54/255  mediastinal]
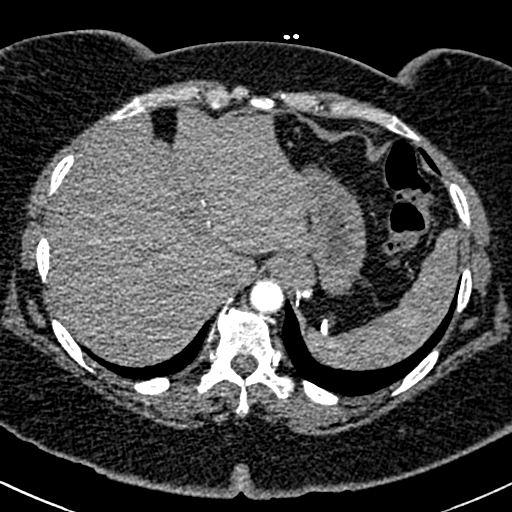
[im 67/255  lung]
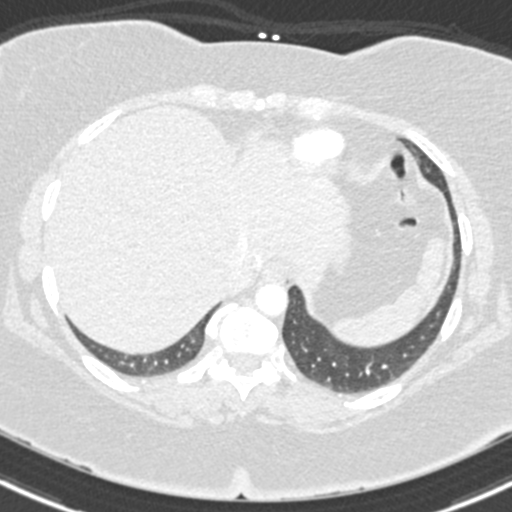
[im 81/255  mediastinal]
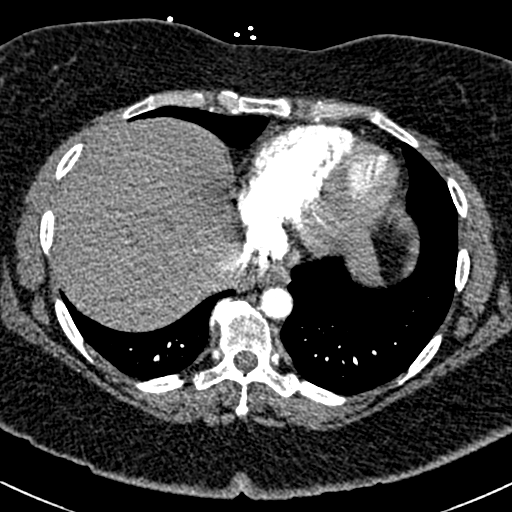
[im 94/255  lung]
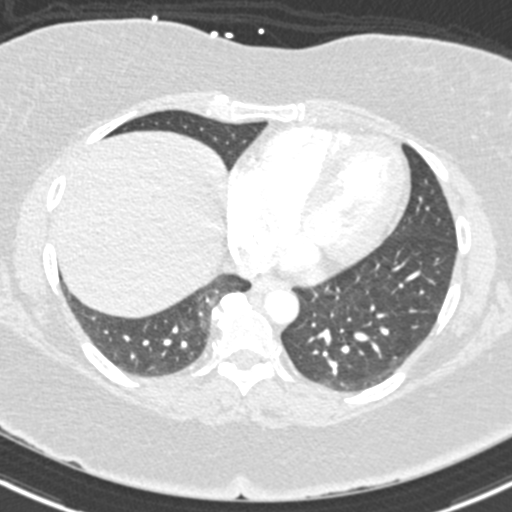
[im 107/255  mediastinal]
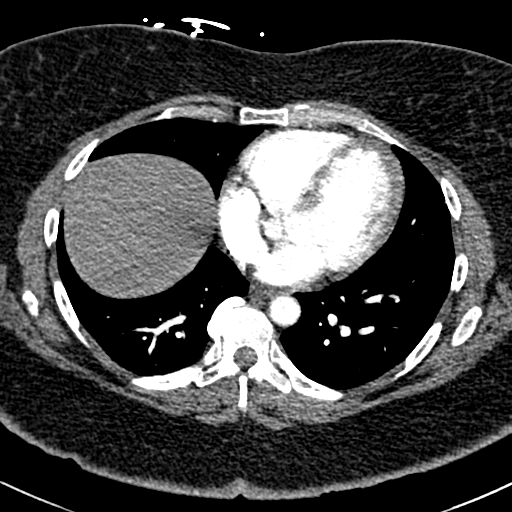
[im 121/255  lung]
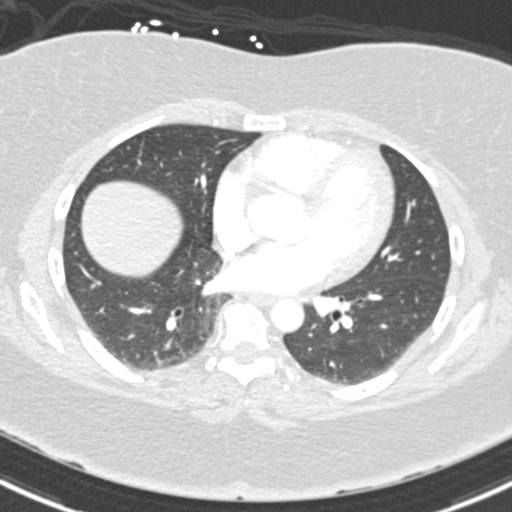
[im 134/255  mediastinal]
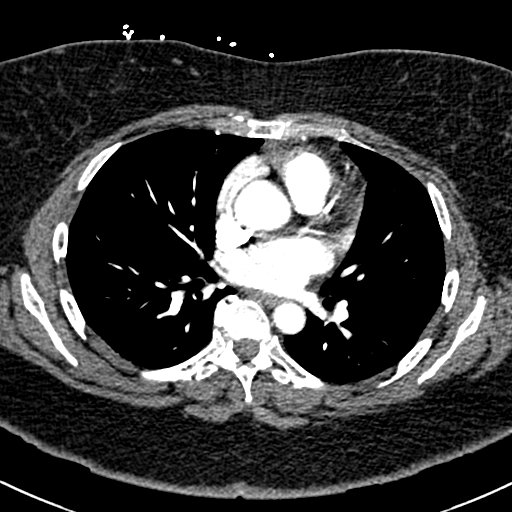
[im 148/255  lung]
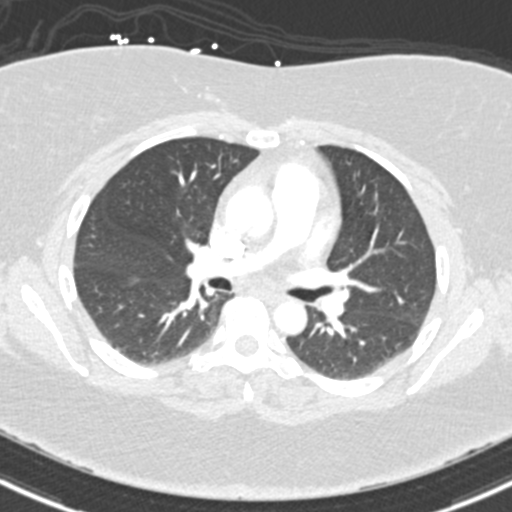
[im 161/255  mediastinal]
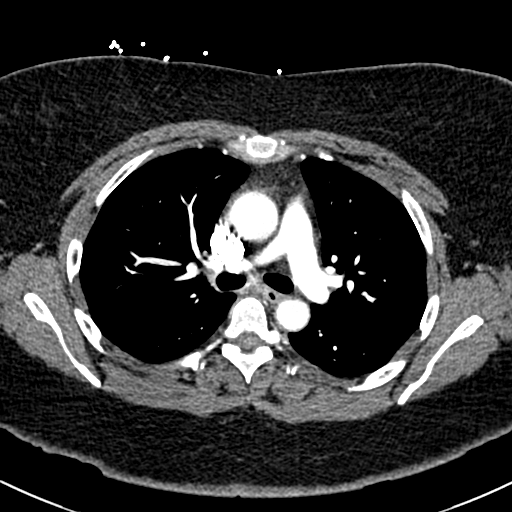
[im 174/255  lung]
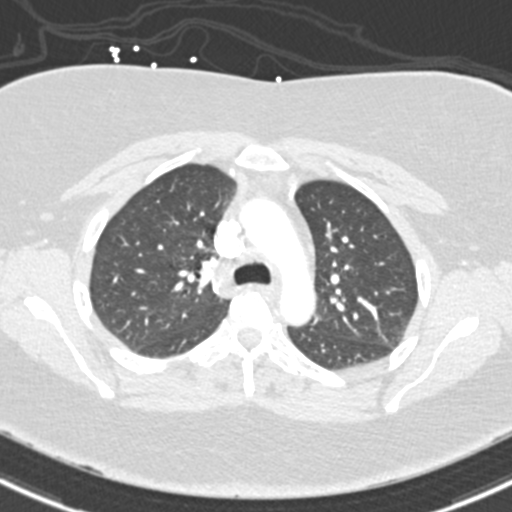
[im 188/255  mediastinal]
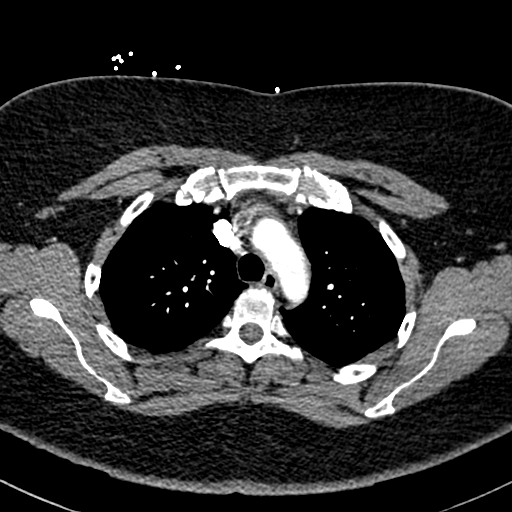
[im 201/255  lung]
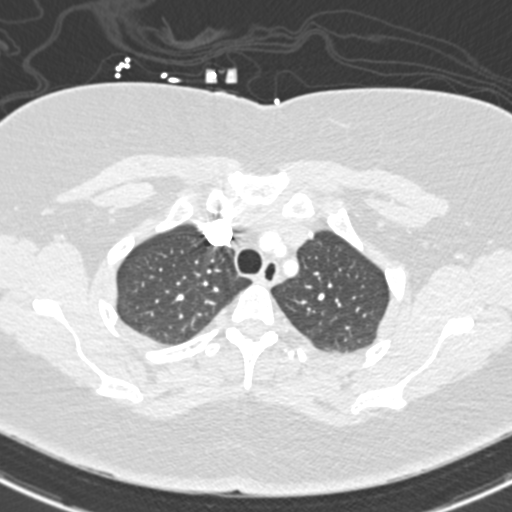
[im 214/255  mediastinal]
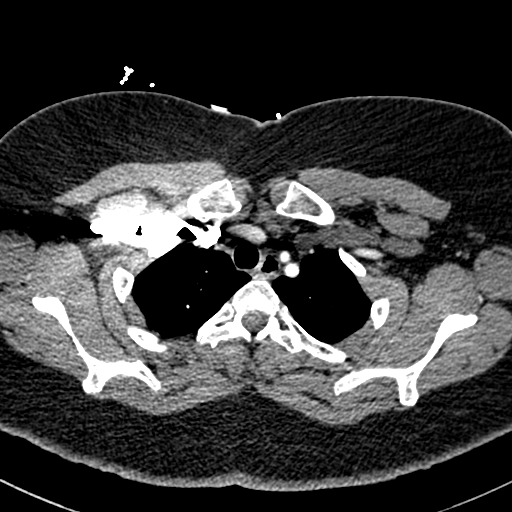
[im 228/255  lung]
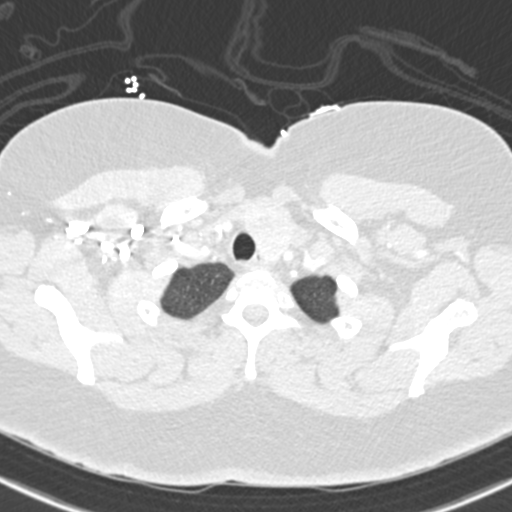
[im 241/255  mediastinal]
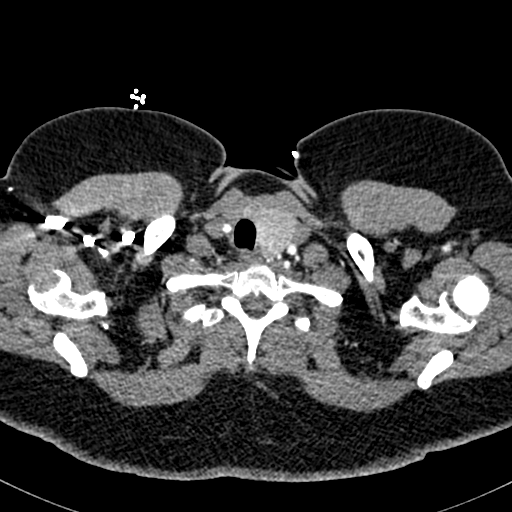

[Series 7: pe coronal mpr · coronal · 0.54mm/px · 1 of 115 slices shown]
[im 58/115  mediastinal]
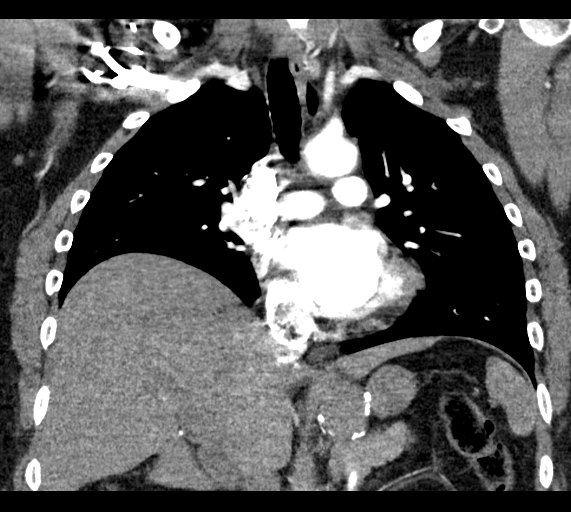

[19 of 36 positions shown; findings below may reference images not displayed]

RADIATION DOSE REDUCTION: This exam was performed according to the
departmental dose-optimization program which includes automated
exposure control, adjustment of the mA and/or kV according to
patient size and/or use of iterative reconstruction technique.

CONTRAST:  75mL OMNIPAQUE IOHEXOL 350 MG/ML SOLN
FINDINGS: Cardiovascular: Adequate contrast opacification of the pulmonary
arteries. No evidence of pulmonary embolus. Normal heart size. No
pericardial effusion. Stent versus calcifications of the LAD. No
significant atherosclerotic disease of the thoracic aorta.

Mediastinum/Nodes: Prior right thyroidectomy with heterogeneous
enlarged left thyroid lobe. Esophagus is unremarkable. No
pathologically enlarged lymph nodes seen in the chest.

Lungs/Pleura: Central airways are patent. No consolidation, pleural
effusion or pneumothorax.

Upper Abdomen: Postsurgical changes of the stomach. No acute
abnormality.

Musculoskeletal: No chest wall abnormality. No acute or significant
osseous findings.

Review of the MIP images confirms the above findings.
IMPRESSION: No evidence of pulmonary embolus.

## 2024-06-22 ENCOUNTER — Emergency Department (HOSPITAL_BASED_OUTPATIENT_CLINIC_OR_DEPARTMENT_OTHER)

## 2024-06-22 ENCOUNTER — Emergency Department (HOSPITAL_BASED_OUTPATIENT_CLINIC_OR_DEPARTMENT_OTHER)
Admission: EM | Admit: 2024-06-22 | Discharge: 2024-06-22 | Disposition: A | Discharge status: Home / Self Care | Attending: Emergency Medicine | Admitting: Emergency Medicine

## 2024-06-22 ENCOUNTER — Other Ambulatory Visit (HOSPITAL_BASED_OUTPATIENT_CLINIC_OR_DEPARTMENT_OTHER): Payer: Self-pay

## 2024-06-22 ENCOUNTER — Other Ambulatory Visit: Payer: Self-pay

## 2024-06-22 DIAGNOSIS — K8050 Calculus of bile duct without cholangitis or cholecystitis without obstruction: Secondary | ICD-10-CM | POA: Diagnosis not present

## 2024-06-22 DIAGNOSIS — I2510 Atherosclerotic heart disease of native coronary artery without angina pectoris: Secondary | ICD-10-CM | POA: Diagnosis not present

## 2024-06-22 DIAGNOSIS — I10 Essential (primary) hypertension: Secondary | ICD-10-CM | POA: Diagnosis not present

## 2024-06-22 DIAGNOSIS — Z79899 Other long term (current) drug therapy: Secondary | ICD-10-CM | POA: Insufficient documentation

## 2024-06-22 DIAGNOSIS — R1010 Upper abdominal pain, unspecified: Secondary | ICD-10-CM

## 2024-06-22 DIAGNOSIS — R1011 Right upper quadrant pain: Secondary | ICD-10-CM | POA: Diagnosis present

## 2024-06-22 DIAGNOSIS — R944 Abnormal results of kidney function studies: Secondary | ICD-10-CM | POA: Diagnosis not present

## 2024-06-22 DIAGNOSIS — N281 Cyst of kidney, acquired: Secondary | ICD-10-CM

## 2024-06-22 LAB — URINALYSIS, ROUTINE W REFLEX MICROSCOPIC
Bilirubin Urine: NEGATIVE
Glucose, UA: NEGATIVE mg/dL
Ketones, ur: NEGATIVE mg/dL
Leukocytes,Ua: NEGATIVE
Nitrite: NEGATIVE
Protein, ur: NEGATIVE mg/dL
Specific Gravity, Urine: 1.02 (ref 1.005–1.030)
pH: 5.5 (ref 5.0–8.0)

## 2024-06-22 LAB — COMPREHENSIVE METABOLIC PANEL WITH GFR
ALT: 38 U/L (ref 0–44)
AST: 28 U/L (ref 15–41)
Albumin: 4.3 g/dL (ref 3.5–5.0)
Alkaline Phosphatase: 91 U/L (ref 38–126)
Anion gap: 10 (ref 5–15)
BUN: 22 mg/dL — ABNORMAL HIGH (ref 6–20)
CO2: 27 mmol/L (ref 22–32)
Calcium: 9.4 mg/dL (ref 8.9–10.3)
Chloride: 106 mmol/L (ref 98–111)
Creatinine, Ser: 1.21 mg/dL — ABNORMAL HIGH (ref 0.44–1.00)
GFR, Estimated: 53 mL/min — ABNORMAL LOW (ref >60)
Glucose, Bld: 74 mg/dL (ref 70–99)
Potassium: 4 mmol/L (ref 3.5–5.1)
Sodium: 142 mmol/L (ref 135–145)
Total Bilirubin: 0.8 mg/dL (ref 0.0–1.2)
Total Protein: 7.2 g/dL (ref 6.5–8.1)

## 2024-06-22 LAB — LIPASE, BLOOD: Lipase: 30 U/L (ref 11–51)

## 2024-06-22 LAB — URINALYSIS, MICROSCOPIC (REFLEX)
Squamous Epithelial / HPF: NONE SEEN /HPF (ref 0–5)
WBC, UA: NONE SEEN WBC/hpf (ref 0–5)

## 2024-06-22 LAB — CBC
HCT: 34.8 % — ABNORMAL LOW (ref 36.0–46.0)
Hemoglobin: 11.6 g/dL — ABNORMAL LOW (ref 12.0–15.0)
MCH: 30.5 pg (ref 26.0–34.0)
MCHC: 33.3 g/dL (ref 30.0–36.0)
MCV: 91.6 fL (ref 80.0–100.0)
Platelets: 285 K/uL (ref 150–400)
RBC: 3.8 MIL/uL — ABNORMAL LOW (ref 3.87–5.11)
RDW: 12.8 % (ref 11.5–15.5)
WBC: 4.7 K/uL (ref 4.0–10.5)
nRBC: 0 % (ref 0.0–0.2)

## 2024-06-22 LAB — OCCULT BLOOD X 1 CARD TO LAB, STOOL: Fecal Occult Bld: NEGATIVE

## 2024-06-22 MED ORDER — MORPHINE SULFATE (PF) 4 MG/ML IV SOLN
4.0000 mg | Freq: Once | INTRAVENOUS | Status: AC
  Administered 2024-06-22: 4 mg via INTRAVENOUS
  Filled 2024-06-22: qty 1

## 2024-06-22 MED ORDER — DICYCLOMINE HCL 20 MG PO TABS
20.0000 mg | ORAL_TABLET | Freq: Two times a day (BID) | ORAL | 0 refills | Status: AC
  Filled 2024-06-22: qty 20, 10d supply, fill #0

## 2024-06-22 MED ORDER — SODIUM CHLORIDE 0.9 % IV BOLUS
1000.0000 mL | Freq: Once | INTRAVENOUS | Status: AC
  Administered 2024-06-22: 1000 mL via INTRAVENOUS

## 2024-06-22 MED ORDER — ONDANSETRON HCL 4 MG/2ML IJ SOLN
4.0000 mg | Freq: Once | INTRAMUSCULAR | Status: AC
  Administered 2024-06-22: 4 mg via INTRAVENOUS
  Filled 2024-06-22: qty 2

## 2024-06-22 MED ORDER — IOHEXOL 300 MG/ML  SOLN
100.0000 mL | Freq: Once | INTRAMUSCULAR | Status: AC | PRN
  Administered 2024-06-22: 100 mL via INTRAVENOUS

## 2024-06-22 NOTE — ED Triage Notes (Signed)
 Pt reports upper abdominal pain X 5 days. Endorse N/V/D.

## 2024-06-22 NOTE — ED Provider Notes (Signed)
 [place] [facility] Provider Note   CSN: [id number] Arrival date & time: 06/22/24  1512     Patient presents with: Abdominal Pain   Amanda Nielsen is a 53 y.o. female with a past medical history significant for hypertension, CAD, hyperlipidemia, and morbid obesity who presents to the ED due to upper abdominal pain associated with nausea, vomiting, and diarrhea x 5 days.  Patient notes pain is constant however, sometimes worse after eating.  Admits to a few episodes of nonbloody, nonbilious emesis.  And numerous episodes of nonbloody diarrhea.  Denies chest pain and shortness of breath.  Also admits to some right sided mid back pain.  No injury.  No rash.  Previous gastric bypass however, no other abdominal operations.  Gallbladder intact.  History obtained from patient and past medical records. No interpreter used during encounter.       Prior to Admission medications   Medication Sig Start Date End Date Taking? Authorizing Provider  B Complex-C (B-COMPLEX WITH VITAMIN C) tablet Take 1 tablet by mouth daily.    [provider]  BIOTIN PO Take 1 tablet by mouth daily.    [provider]  calcium-vitamin D (OSCAL WITH D) 500-200 MG-UNIT tablet Take 1 tablet by mouth daily.    [provider]  colchicine  0.6 MG tablet Take 1 tablet (0.6 mg total) by mouth 2 (two) times daily. Patient taking differently: Take 0.6 mg by mouth 2 (two) times daily as needed (gout).  05/26/15   Hudnall, Ludie SAUNDERS, MD  Cyanocobalamin (VITAMIN B-12 PO) Take 1 tablet by mouth daily.    [provider]  cyclobenzaprine  (FLEXERIL ) 10 MG tablet Take 1 tablet (10 mg total) by mouth 2 (two) times daily as needed for muscle spasms. 10/12/21   Dreama Longs, MD  ferrous sulfate 325 (65 FE) MG tablet Take 65 mg by mouth daily with breakfast.    [provider]  HYDRALAZINE-HCTZ PO Take by mouth.    [provider]  lisinopril  (PRINIVIL,ZESTRIL) 20 MG tablet Take 20 mg by mouth daily.  03/05/15   [provider]  metoprolol tartrate (LOPRESSOR) 25 MG tablet Take 25 mg by mouth 2 (two) times daily.      [provider]  Multiple Vitamin (MULTIVITAMIN WITH MINERALS) TABS tablet Take 1 tablet by mouth daily.    [provider]  naproxen  (NAPROSYN ) 500 MG tablet Take 1 tablet (500 mg total) by mouth 2 (two) times daily. 01/05/23   Henderly, Britni A, PA-C  ondansetron  (ZOFRAN  ODT) 8 MG disintegrating tablet Take 1 tablet (8 mg total) by mouth every 8 (eight) hours as needed for nausea or vomiting. 01/29/21   Molpus, Norleen, MD  pantoprazole  (PROTONIX ) 20 MG tablet Take 1 tablet every morning at least 30 minutes before first dose of Carafate . 01/29/21   Molpus, John, MD  predniSONE  (STERAPRED UNI-PAK 21 TAB) 10 MG (21) TBPK tablet Take by mouth daily. Take 4 tabs by mouth daily  for 2 days, then 3 tabs for 2 days, then 2 tabs for 2 days, then 1 tabs for 2 days 08/14/23   Small, Brooke L, PA  sucralfate  (CARAFATE ) 1 g tablet Take 1 tablet (1 g total) by mouth 4 (four) times daily -  with meals and at bedtime. 01/29/21   Molpus, John, MD    Allergies: Hydromorphone , Oxycodone -acetaminophen , and Tramadol     Review of Systems  Respiratory:  Negative for shortness of breath.   Cardiovascular:  Negative for chest pain.  Gastrointestinal:  Positive for abdominal pain, diarrhea, nausea and vomiting.    Updated Vital Signs BP 128/80 (BP Location: Right Arm)   Pulse 77   Temp 98.9 F (37.2 C) (Oral)   Resp 18   SpO2 98%   Physical Exam Vitals and nursing note reviewed.  Constitutional:      General: She is not in acute distress.    Appearance: She is not ill-appearing.  HENT:     Head: Normocephalic.  Eyes:     Pupils: Pupils are equal, round, and reactive to light.  Cardiovascular:     Rate and Rhythm: Normal rate and regular rhythm.     Pulses: Normal pulses.     Heart sounds: Normal heart sounds.  No murmur heard.    No friction rub. No gallop.  Pulmonary:     Effort: Pulmonary effort is normal.     Breath sounds: Normal breath sounds.  Abdominal:     General: Abdomen is flat. There is no distension.     Palpations: Abdomen is soft.     Tenderness: There is abdominal tenderness. There is no guarding or rebound.     Comments: RUQ and epigastric tenderness. No rebound or guarding  Musculoskeletal:        General: Normal range of motion.     Cervical back: Neck supple.  Skin:    General: Skin is warm and dry.  Neurological:     General: No focal deficit present.     Mental Status: She is alert.  Psychiatric:        Mood and Affect: Mood normal.        Behavior: Behavior normal.     (all labs ordered are listed, but only abnormal results are displayed) Labs Reviewed  COMPREHENSIVE METABOLIC PANEL WITH GFR - Abnormal; Notable for the following components:      Result Value   BUN 22 (*)    Creatinine, Ser 1.21 (*)    GFR, Estimated 53 (*)    All other components within normal limits  CBC - Abnormal; Notable for the following components:   RBC 3.80 (*)    Hemoglobin 11.6 (*)    HCT 34.8 (*)    All other components within normal limits  URINALYSIS, ROUTINE W REFLEX MICROSCOPIC - Abnormal; Notable for the following components:   Hgb urine dipstick SMALL (*)    All other components within normal limits  URINALYSIS, MICROSCOPIC (REFLEX) - Abnormal; Notable for the following components:   Bacteria, UA RARE (*)    All other components within normal limits  LIPASE, BLOOD  OCCULT BLOOD X 1 CARD TO LAB, STOOL    EKG: EKG Interpretation Date/Time:  Friday June 22 2024 15:23:38 EST Ventricular Rate:  76 PR Interval:  214 QRS Duration:  95 QT Interval:  378 QTC Calculation: 425 R Axis:   14  Text Interpretation: Sinus rhythm Prolonged PR interval Low voltage, precordial leads Confirmed by Cottie Cough [phone]) on 06/22/2024 3:25:38 PM  Radiology: CT ABDOMEN  PELVIS W CONTRAST Result Date: 06/22/2024 CLINICAL DATA:  Severe pancreatitis EXAM: CT ABDOMEN AND PELVIS WITH CONTRAST TECHNIQUE: Multidetector CT imaging of the abdomen and pelvis was performed using the standard protocol following bolus administration of intravenous contrast. RADIATION DOSE REDUCTION: This exam was performed according to the departmental dose-optimization program which includes automated exposure control, adjustment of the mA and/or kV according to patient size and/or use of iterative reconstruction technique. CONTRAST:  100mL OMNIPAQUE  IOHEXOL  300  MG/ML  SOLN COMPARISON:  CT abdomen and pelvis 10/07/2022 FINDINGS: Lower chest: No acute abnormality. Hepatobiliary: No focal liver abnormality is seen. No gallstones, gallbladder wall thickening, or biliary dilatation. Pancreas: Unremarkable. No pancreatic ductal dilatation or surrounding inflammatory changes. Spleen: Normal in size without focal abnormality. Adrenals/Urinary Tract: Bladder is not well evaluated secondary to streak artifact in the pelvis. Bilateral renal cysts are present. The largest is in the left kidney measuring 5 cm. There is no hydronephrosis or perinephric fat stranding. Adrenal glands are within normal limits. Stomach/Bowel: There are postsurgical changes in the stomach. Appendix appears normal. No evidence of bowel wall thickening, distention, or inflammatory changes. Vascular/Lymphatic: No significant vascular findings are present. No enlarged abdominal or pelvic lymph nodes. Reproductive: There is an IUD in the uterus which may be slightly oblique in position. The adnexa are unremarkable. Other: No abdominal wall hernia or abnormality. No abdominopelvic ascites. Musculoskeletal: Left hip arthroplasty is present. No acute fractures. IMPRESSION: 1. No acute localizing process in the abdomen or pelvis. 2. IUD in the uterus may be slightly oblique in position. This can be further evaluated with pelvic ultrasound.  Electronically Signed   By: Greig Pique M.D.   On: 06/22/2024 18:07   US  Abdomen Limited RUQ (LIVER/GB) Result Date: 06/22/2024 CLINICAL DATA:  Epigastric and right upper quadrant pain. History of bariatric surgery. EXAM: ULTRASOUND ABDOMEN LIMITED RIGHT UPPER QUADRANT COMPARISON:  08/15/2016.  CT abdomen pelvis, 10/07/2022. FINDINGS: Gallbladder: Two adjacent stones in the gallbladder neck, largest 7 mm. There is also some gallbladder sludge dependently. No wall thickening or pericholecystic fluid. No sonographic Murphy's sign. Common bile duct: Diameter: 7 mm. Liver: Coarsened and overall increased liver parenchymal echogenicity. Normal liver size. No mass. Portal vein is patent on color Doppler imaging with normal direction of blood flow towards the liver. Other: None. IMPRESSION: 1. Somewhat technically limited study due to bowel gas and patient's body habitus. 2. Two small gallstones in the gallbladder neck, but no sonographic evidence of acute cholecystitis. 3. Coarsened and relatively increased liver parenchymal echogenicity suggests hepatic steatosis. Electronically Signed   By: Alm Parkins M.D.   On: 06/22/2024 16:36     Procedures   Medications Ordered in the ED  ondansetron  (ZOFRAN ) injection 4 mg (4 mg Intravenous Given 06/22/24 1545)  morphine  (PF) 4 MG/ML injection 4 mg (4 mg Intravenous Given 06/22/24 1547)  iohexol  (OMNIPAQUE ) 300 MG/ML solution 100 mL (100 mLs Intravenous Contrast Given 06/22/24 1736)  sodium chloride  0.9 % bolus 1,000 mL (1,000 mLs Intravenous New Bag/Given 06/22/24 1757)    Clinical Course as of 06/22/24 1818  Fri Jun 22, 2024  1756 Fecal Occult Blood, POC: NEGATIVE [CA]    Clinical Course User Index [CA] Lorelle Aleck BROCKS, PA-C                                 Medical Decision Making Amount and/or Complexity of Data Reviewed Labs: ordered. Decision-making details documented in ED Course. Radiology: ordered and independent interpretation  performed. Decision-making details documented in ED Course. ECG/medicine tests: ordered and independent interpretation performed. Decision-making details documented in ED Course.  Risk Prescription drug management.   This patient presents to the ED for concern of upper abdominal pain, this involves an extensive number of treatment options, and is a complaint that carries with it a high risk of complications and morbidity.  The differential diagnosis includes pancreatitis, acute cholecystitis, ACS, PE, etc  53 year old  female presents to the ED due to upper abdominal pain associated with nausea, vomiting, and diarrhea x 5 days.  Previous gastric bypass however, no other abdominal operations.  Gallbladder intact.  No fever or chills.  Upon arrival, vitals all within normal limits.  Patient well-appearing on exam.  Does have right upper quadrant and epigastric tenderness.  No rebound or guarding.  Routine labs ordered.  Will start with right upper quadrant ultrasound.  If unremarkable may need CT abdomen.  IV morphine  and Zofran  given. No chest pain or shortness of breath. Low suspicion for ACS or PE. Denies alcohol and chronic NSAID use.   CBC with no leukocytosis.  Hemoglobin 11.6.  CMP with elevated creatinine 1.21.  IV fluids given.  No major electrolyte derangements.  Normal LFTs.  Lipase normal.  Low suspicion for pancreatitis.  UA negative for signs of infection.  Right upper quadrant ultrasound personally reviewed and interpreted which demonstrates 2 small gallstones in gallbladder neck.  No evidence of acute cholecystitis.  Also demonstrates a possible hepatic steatosis. EKG NSR no signs of acute ischemia. Low suspicion for ACS.   5:29 PM informed by RN that patient's daughter notes that patient has had bloody stool today.  Reported to bedside.  Patient states her stool was completely bright red.  No history of GI bleed.  Rectal exam performed with chaperone in room.  1 external hemorrhoid without  evidence of thrombosis.  Was able to get a small amount of stool which was light brown in color. CT abdomen ordered.  Fecal occult negative. Low suspicion for GI bleed.  CT abdomen personally reviewed and interpreted which is negative for any acute abnormalities.  Does demonstrate renal cyst bilaterally.  Also demonstrates IUD in the oblique position in the uterus.  No evidence of perforation.  Advised patient to follow-up with OB/GYN for further evaluation.  Patient denies any pelvic pain.  Suspect symptoms likely related to biliary colic.  General surgery number gated patient at discharge.  No evidence of acute cholecystitis.  Normal LFTs.  Patient stable for discharge. Strict ED precautions discussed with patient. Patient states understanding and agrees to plan. Patient discharged home in no acute distress and stable vitals  Co morbidities that complicate the patient evaluation  Morbid obesity Cardiac Monitoring: / EKG:  The patient was maintained on a cardiac monitor.  I personally viewed and interpreted the cardiac monitored which showed an underlying rhythm of: NSR  Social Determinants of Health:  Lives independently     Final diagnoses:  Pain of upper abdomen  Biliary colic    ED Discharge Orders     None          Lorelle Aleck JAYSON DEVONNA 06/22/24 1818    Cottie Donnice PARAS, MD 06/22/24 1919

## 2024-06-22 NOTE — Discharge Instructions (Addendum)
 It was a pleasure taking care of you today.  As discussed, your ultrasound showed gallstones which could be causing your pain.  I have included the number of the general surgeon.  Please call Monday to schedule an appointment.  Your labs are reassuring.  Your IUD is slightly malposition.  Please call your OB/GYN for further evaluation.  Your CT scan also showed cysts in your kidney. Return to the ER for any worsening symptoms.
